# Patient Record
Sex: Female | Born: 1945 | Race: White | Hispanic: No | Marital: Single | Smoking: Former smoker
Health system: Southern US, Community
[De-identification: ages and names within clinical notes are randomized; demographics above are authoritative.]

## PROBLEM LIST (undated history)

## (undated) DIAGNOSIS — C259 Malignant neoplasm of pancreas, unspecified: Secondary | ICD-10-CM

## (undated) DIAGNOSIS — C801 Malignant (primary) neoplasm, unspecified: Secondary | ICD-10-CM

## (undated) DIAGNOSIS — N200 Calculus of kidney: Secondary | ICD-10-CM

## (undated) HISTORY — PX: KIDNEY STONE SURGERY: SHX686

## (undated) HISTORY — PX: OTHER SURGICAL HISTORY: SHX169

## (undated) HISTORY — PX: TONSILLECTOMY: SUR1361

---
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
## 2004-04-12 ENCOUNTER — Ambulatory Visit: Payer: Self-pay | Admitting: Unknown Physician Specialty

## 2005-04-25 ENCOUNTER — Ambulatory Visit: Payer: Self-pay | Admitting: Unknown Physician Specialty

## 2006-08-10 ENCOUNTER — Ambulatory Visit: Payer: Self-pay | Admitting: Unknown Physician Specialty

## 2007-03-01 HISTORY — PX: STAPEDECTOMY: SHX2435

## 2008-01-30 ENCOUNTER — Ambulatory Visit: Payer: Self-pay | Admitting: Family Medicine

## 2008-06-11 ENCOUNTER — Ambulatory Visit: Payer: Self-pay | Admitting: Urology

## 2008-10-28 ENCOUNTER — Other Ambulatory Visit: Payer: Self-pay | Admitting: Family Medicine

## 2009-03-17 ENCOUNTER — Emergency Department: Payer: Self-pay | Admitting: Emergency Medicine

## 2009-03-26 ENCOUNTER — Ambulatory Visit: Payer: Self-pay | Admitting: Family Medicine

## 2010-07-01 ENCOUNTER — Ambulatory Visit: Payer: Self-pay | Admitting: Family Medicine

## 2010-11-12 ENCOUNTER — Ambulatory Visit: Payer: Self-pay | Admitting: Unknown Physician Specialty

## 2011-07-05 ENCOUNTER — Ambulatory Visit: Payer: Self-pay | Admitting: Family Medicine

## 2012-09-18 ENCOUNTER — Ambulatory Visit: Payer: Self-pay | Admitting: Family Medicine

## 2013-09-27 ENCOUNTER — Ambulatory Visit: Payer: Self-pay | Admitting: Family Medicine

## 2014-07-22 ENCOUNTER — Other Ambulatory Visit: Payer: Self-pay | Admitting: Family Medicine

## 2014-07-22 DIAGNOSIS — N12 Tubulo-interstitial nephritis, not specified as acute or chronic: Secondary | ICD-10-CM

## 2014-07-24 ENCOUNTER — Ambulatory Visit
Admission: RE | Admit: 2014-07-24 | Discharge: 2014-07-24 | Disposition: A | Discharge status: Home / Self Care | Payer: Medicare PPO | Source: Ambulatory Visit | Attending: Family Medicine | Admitting: Family Medicine

## 2014-07-24 DIAGNOSIS — N12 Tubulo-interstitial nephritis, not specified as acute or chronic: Secondary | ICD-10-CM | POA: Diagnosis not present

## 2014-07-30 ENCOUNTER — Other Ambulatory Visit: Payer: Self-pay | Admitting: Urology

## 2014-07-30 DIAGNOSIS — R109 Unspecified abdominal pain: Secondary | ICD-10-CM

## 2014-07-30 DIAGNOSIS — R319 Hematuria, unspecified: Secondary | ICD-10-CM

## 2014-07-31 ENCOUNTER — Ambulatory Visit
Admission: RE | Admit: 2014-07-31 | Discharge: 2014-07-31 | Disposition: A | Discharge status: Home / Self Care | Payer: Medicare PPO | Source: Ambulatory Visit | Attending: Urology | Admitting: Urology

## 2014-07-31 ENCOUNTER — Ambulatory Visit: Admission: RE | Admit: 2014-07-31 | Payer: Medicare PPO | Source: Ambulatory Visit

## 2014-07-31 DIAGNOSIS — R109 Unspecified abdominal pain: Secondary | ICD-10-CM

## 2014-07-31 DIAGNOSIS — R310 Gross hematuria: Secondary | ICD-10-CM | POA: Insufficient documentation

## 2014-07-31 DIAGNOSIS — N289 Disorder of kidney and ureter, unspecified: Secondary | ICD-10-CM | POA: Diagnosis not present

## 2014-07-31 DIAGNOSIS — N200 Calculus of kidney: Secondary | ICD-10-CM | POA: Diagnosis not present

## 2014-07-31 DIAGNOSIS — R319 Hematuria, unspecified: Secondary | ICD-10-CM

## 2014-07-31 HISTORY — DX: Calculus of kidney: N20.0

## 2014-07-31 MED ORDER — IOHEXOL 350 MG/ML SOLN
125.0000 mL | Freq: Once | INTRAVENOUS | Status: AC | PRN
  Administered 2014-07-31: 150 mL via INTRAVENOUS

## 2014-08-01 ENCOUNTER — Telehealth: Payer: Self-pay | Admitting: Urology

## 2014-08-01 ENCOUNTER — Encounter: Payer: Self-pay | Admitting: Urology

## 2014-08-01 DIAGNOSIS — E559 Vitamin D deficiency, unspecified: Secondary | ICD-10-CM | POA: Insufficient documentation

## 2014-08-01 DIAGNOSIS — M8580 Other specified disorders of bone density and structure, unspecified site: Secondary | ICD-10-CM | POA: Insufficient documentation

## 2014-08-01 DIAGNOSIS — N200 Calculus of kidney: Secondary | ICD-10-CM | POA: Insufficient documentation

## 2014-08-01 DIAGNOSIS — E785 Hyperlipidemia, unspecified: Secondary | ICD-10-CM | POA: Insufficient documentation

## 2014-08-01 DIAGNOSIS — N132 Hydronephrosis with renal and ureteral calculous obstruction: Secondary | ICD-10-CM

## 2014-08-01 DIAGNOSIS — N951 Menopausal and female climacteric states: Secondary | ICD-10-CM | POA: Insufficient documentation

## 2014-08-01 MED ORDER — SULFAMETHOXAZOLE-TRIMETHOPRIM 800-160 MG PO TABS: 1.0000 | ORAL_TABLET | Freq: Once | ORAL | Status: DC

## 2014-08-01 NOTE — Telephone Encounter (Signed)
Talked to patient about stone and chance for eswl.to come in Monday for review of kub

## 2014-09-25 ENCOUNTER — Other Ambulatory Visit: Payer: Self-pay | Admitting: Family Medicine

## 2014-09-25 DIAGNOSIS — Z1239 Encounter for other screening for malignant neoplasm of breast: Secondary | ICD-10-CM

## 2014-10-07 ENCOUNTER — Ambulatory Visit
Admission: RE | Admit: 2014-10-07 | Discharge: 2014-10-07 | Disposition: A | Discharge status: Home / Self Care | Payer: Medicare PPO | Source: Ambulatory Visit | Attending: Family Medicine | Admitting: Family Medicine

## 2014-10-07 DIAGNOSIS — R922 Inconclusive mammogram: Secondary | ICD-10-CM | POA: Diagnosis not present

## 2014-10-07 DIAGNOSIS — Z1239 Encounter for other screening for malignant neoplasm of breast: Secondary | ICD-10-CM

## 2014-10-07 DIAGNOSIS — Z1231 Encounter for screening mammogram for malignant neoplasm of breast: Secondary | ICD-10-CM | POA: Diagnosis present

## 2014-10-07 HISTORY — DX: Malignant (primary) neoplasm, unspecified: C80.1

## 2014-10-08 ENCOUNTER — Ambulatory Visit: Payer: Medicare PPO

## 2014-10-08 ENCOUNTER — Ambulatory Visit
Admission: RE | Admit: 2014-10-08 | Discharge: 2014-10-08 | Disposition: A | Discharge status: Home / Self Care | Payer: Medicare PPO | Source: Ambulatory Visit | Attending: Family Medicine | Admitting: Family Medicine

## 2014-10-08 ENCOUNTER — Other Ambulatory Visit: Payer: Self-pay | Admitting: Family Medicine

## 2014-10-08 DIAGNOSIS — R928 Other abnormal and inconclusive findings on diagnostic imaging of breast: Secondary | ICD-10-CM

## 2014-10-08 DIAGNOSIS — N6489 Other specified disorders of breast: Secondary | ICD-10-CM | POA: Diagnosis present

## 2015-10-05 ENCOUNTER — Other Ambulatory Visit: Payer: Self-pay | Admitting: Family Medicine

## 2015-10-05 DIAGNOSIS — Z1231 Encounter for screening mammogram for malignant neoplasm of breast: Secondary | ICD-10-CM

## 2015-10-21 ENCOUNTER — Other Ambulatory Visit: Payer: Self-pay | Admitting: Family Medicine

## 2015-10-21 ENCOUNTER — Ambulatory Visit
Admission: RE | Admit: 2015-10-21 | Discharge: 2015-10-21 | Disposition: A | Discharge status: Home / Self Care | Payer: Medicare HMO | Source: Ambulatory Visit | Attending: Family Medicine | Admitting: Family Medicine

## 2015-10-21 DIAGNOSIS — Z1231 Encounter for screening mammogram for malignant neoplasm of breast: Secondary | ICD-10-CM

## 2016-05-17 HISTORY — PX: PORTA CATH INSERTION: CATH118285

## 2016-08-13 ENCOUNTER — Encounter: Payer: Self-pay | Admitting: Emergency Medicine

## 2016-08-13 ENCOUNTER — Emergency Department: Payer: Medicare HMO

## 2016-08-13 ENCOUNTER — Emergency Department
Admission: EM | Admit: 2016-08-13 | Discharge: 2016-08-13 | Disposition: A | Discharge status: Home / Self Care | Payer: Medicare HMO | Attending: Emergency Medicine | Admitting: Emergency Medicine

## 2016-08-13 DIAGNOSIS — Y998 Other external cause status: Secondary | ICD-10-CM | POA: Insufficient documentation

## 2016-08-13 DIAGNOSIS — Y92003 Bedroom of unspecified non-institutional (private) residence as the place of occurrence of the external cause: Secondary | ICD-10-CM | POA: Diagnosis not present

## 2016-08-13 DIAGNOSIS — Y9389 Activity, other specified: Secondary | ICD-10-CM | POA: Insufficient documentation

## 2016-08-13 DIAGNOSIS — W182XXA Fall in (into) shower or empty bathtub, initial encounter: Secondary | ICD-10-CM | POA: Diagnosis not present

## 2016-08-13 DIAGNOSIS — Z85828 Personal history of other malignant neoplasm of skin: Secondary | ICD-10-CM | POA: Insufficient documentation

## 2016-08-13 DIAGNOSIS — S42222A 2-part displaced fracture of surgical neck of left humerus, initial encounter for closed fracture: Secondary | ICD-10-CM | POA: Diagnosis not present

## 2016-08-13 DIAGNOSIS — S4992XA Unspecified injury of left shoulder and upper arm, initial encounter: Secondary | ICD-10-CM | POA: Diagnosis present

## 2016-08-13 HISTORY — DX: Malignant neoplasm of pancreas, unspecified: C25.9

## 2016-08-13 MED ORDER — BACLOFEN 10 MG PO TABS: 10.0000 mg | ORAL_TABLET | Freq: Three times a day (TID) | ORAL | 0 refills | Status: DC

## 2016-08-13 MED ORDER — OXYCODONE HCL 5 MG PO TABS: 5.0000 mg | ORAL_TABLET | Freq: Once | ORAL | Status: DC

## 2016-08-13 NOTE — ED Notes (Signed)
Pt reports receiving Chemo for Pancreatic CA, last treatment 2 weeks ago.

## 2016-08-13 NOTE — ED Triage Notes (Signed)
Pt reports she fell yesterday reports she was sitting at edge of tub and fell backwards, hit the left shoulder on the toilet. Visible swelling and bruise to left arm. Reports if no movement no pain but if stand to walk pain increases, pt talks in complete sentences no distress noted.

## 2016-08-13 NOTE — ED Provider Notes (Signed)
[facility] Emergency Department Provider Note ____________________________________________  Time seen: Approximately 2:13 PM  I have reviewed the triage vital signs and the nursing notes.   HISTORY  Chief Complaint Shoulder Injury    HPI Chelsey Watkins is a 71 y.o. female who presents to the emergency department for evaluation of left shoulder pain. While sitting on the edge of a bathtub, she fell backward and hit her left shoulder on the toilet. She was evaluated at [facility] and placed in anon-supportive sling and discharged home (per patient report). She states pain is unbearable when she is up walking around, but if seated the pain completely resolves. She has a history of osteopenia and is currently undergoing chemotherapy for pancreatic cancer. She takes roxicet when needed.  Past Medical History:  Diagnosis Date  . Cancer ([place])    skin ca  . Kidney stones   . Pancreatic cancer [facility])     Patient Active Problem List   Diagnosis Date Noted  . HLD (hyperlipidemia) 08/01/2014  . Calculus of kidney 08/01/2014  . Osteopenia 08/01/2014  . Post menopausal syndrome 08/01/2014  . Avitaminosis D 08/01/2014    History reviewed. No pertinent surgical history.  Prior to Admission medications   Medication Sig Start Date End Date Taking? Authorizing Provider  baclofen (LIORESAL) 10 MG tablet Take 1 tablet (10 mg total) by mouth 3 (three) times daily. 08/13/16   Victorino Dike, FNP    Allergies Patient has no known allergies.  Family History  Problem Relation Age of Onset  . Breast cancer Paternal Grandmother 45    Social History Social History  Substance Use Topics  . Smoking status: Never Smoker  . Smokeless tobacco: Former Systems developer  . Alcohol use No    Review of Systems Constitutional: Chronically ill appearing. Respiratory: Negative for shortness of breath Musculoskeletal: Positive for left shoulder/upper arm pain. Skin: Positive for  ecchymosis over the left upper arm.  Neurological: Negative for decrease in sensation of the left arm/hand.  ____________________________________________   PHYSICAL EXAM:  VITAL SIGNS: ED Triage Vitals  Enc Vitals Group     BP --      Pulse --      Resp --      Temp --      Temp src --      SpO2 --      Weight 08/13/16 1151 100 lb (45.4 kg)     Height 08/13/16 1151 5\' 2"  (1.575 m)     Head Circumference --      Peak Flow --      Pain Score 08/13/16 1150 0     Pain Loc --      Pain Edu? --      Excl. in [place]? --     Constitutional: Alert and oriented. Well appearing and in no acute distress. Eyes: No conjunctival erythema or edema.  Head: Atraumatic Neck: Active, full range of motion. Respiratory: Respirations are even and unlabored. Musculoskeletal: Bony tenderness of the left upper arm. No tenderness over the left elbow, wrist, or hand. Neurologic: Awake, alert, and oriented x 4.   Skin: Ecchymosis over the left humerus. No open wounds or abrasions ____________________________________________   LABS (all labs ordered are listed, but only abnormal results are displayed)  Labs Reviewed - No data to display ____________________________________________  RADIOLOGY  Left shoulder:  IMPRESSION:  Displaced, impacted surgical neck fracture of the humerus.    ____________________________________________   PROCEDURES  Procedure(s) performed: Shoulder immobilizer  ____________________________________________   INITIAL IMPRESSION / ASSESSMENT AND PLAN / ED COURSE  Chelsey Watkins is a 71 y.o. female who presents to the emergency department for treatment and evaluation of fracture of the left humerus. Sling that was not providing much support at all was replaced with shoulder immobilizer. She states that that did provide her with some relief. She was given a prescription for baclofen and encouraged to continue taking her oxycodone as prescribed. She was advised to  call Monday morning to schedule an appointment with orthopedics and given the name and number for Dr. Suzie Portela office. She was advised to return to the emergency department for symptoms that change or worsen if she is unable schedule an appointment.  Pertinent labs & imaging results that were available during my care of the patient were reviewed by me and considered in my medical decision making (see chart for details).  _________________________________________   FINAL CLINICAL IMPRESSION(S) / ED DIAGNOSES  Final diagnoses:  Closed 2-part displaced fracture of surgical neck of left humerus, initial encounter    Discharge Medication List as of 08/13/2016  2:26 PM    START taking these medications   Details  baclofen (LIORESAL) 10 MG tablet Take 1 tablet (10 mg total) by mouth 3 (three) times daily., Starting Sat 08/13/2016, Print        If controlled substance prescribed during this visit, 12 month history viewed on the [place] prior to issuing an initial prescription for Schedule II or III opiod.    Victorino Dike, FNP 08/13/16 1550    Delman Kitten, MD 08/13/16 [phone]

## 2016-08-13 NOTE — Discharge Instructions (Signed)
Please call Dr. Nicholaus Bloom office on Monday morning to schedule an appointment. Take your oxycodone as prescribed. In addition, take the baclofen if needed for pain. Wear the shoulder immobilizer until you are seen by the orthopedist. Return to the ER for symptoms that change or worsen if unable to schedule an appointment.

## 2016-08-15 ENCOUNTER — Encounter: Payer: Self-pay | Admitting: Emergency Medicine

## 2016-08-15 ENCOUNTER — Emergency Department
Admission: EM | Admit: 2016-08-15 | Discharge: 2016-08-15 | Disposition: A | Discharge status: Home / Self Care | Payer: Medicare HMO | Attending: Emergency Medicine | Admitting: Emergency Medicine

## 2016-08-15 ENCOUNTER — Emergency Department: Payer: Medicare HMO

## 2016-08-15 DIAGNOSIS — Z85828 Personal history of other malignant neoplasm of skin: Secondary | ICD-10-CM | POA: Diagnosis not present

## 2016-08-15 DIAGNOSIS — M545 Low back pain, unspecified: Secondary | ICD-10-CM

## 2016-08-15 MED ORDER — LIDOCAINE 5 % EX PTCH
1.0000 | MEDICATED_PATCH | CUTANEOUS | Status: DC
  Administered 2016-08-15: 1 via TRANSDERMAL
  Filled 2016-08-15: qty 1

## 2016-08-15 MED ORDER — LIDOCAINE 5 % EX PTCH: 1.0000 | MEDICATED_PATCH | Freq: Two times a day (BID) | CUTANEOUS | 0 refills | Status: DC

## 2016-08-15 NOTE — ED Provider Notes (Signed)
[facility] Emergency Department Provider Note   ____________________________________________   I have reviewed the triage vital signs and the nursing notes.   HISTORY  Chief Complaint Back Pain   History limited by: Not Limited   HPI Chelsey Watkins is a 71 y.o. female who presents to the emergency department today because of concerns for back pain. The patient states that it started 4 days ago. She did have a fall 4 days ago. It resulted in a left humerus fracture. She continues to complain of pain there. She states that she did started developing pain in her left lower back. Pain did not start immediately after the fall. She has not noticed any pain going down her legs. No numbness down her legs. No change in urination or defecation.    Past Medical History:  Diagnosis Date  . Cancer ([place])    skin ca  . Kidney stones   . Pancreatic cancer [facility])     Patient Active Problem List   Diagnosis Date Noted  . HLD (hyperlipidemia) 08/01/2014  . Calculus of kidney 08/01/2014  . Osteopenia 08/01/2014  . Post menopausal syndrome 08/01/2014  . Avitaminosis D 08/01/2014    History reviewed. No pertinent surgical history.  Prior to Admission medications   Medication Sig Start Date End Date Taking? Authorizing Provider  baclofen (LIORESAL) 10 MG tablet Take 1 tablet (10 mg total) by mouth 3 (three) times daily. 08/13/16   Triplett, Cari B, FNP  lidocaine (LIDODERM) 5 % Place 1 patch onto the skin every 12 (twelve) hours. Remove & Discard patch within 12 hours or as directed by MD 08/15/16 08/15/17  Nance Pear, MD    Allergies Patient has no known allergies.  Family History  Problem Relation Age of Onset  . Breast cancer Paternal Grandmother 75    Social History Social History  Substance Use Topics  . Smoking status: Never Smoker  . Smokeless tobacco: Former Systems developer  . Alcohol use No    Review of Systems Constitutional: No fever/chills Eyes:  No visual changes. ENT: No sore throat. Cardiovascular: Denies chest pain. Respiratory: Denies shortness of breath. Gastrointestinal: No abdominal pain.  No nausea, no vomiting.  No diarrhea.   Genitourinary: Negative for dysuria. Musculoskeletal: Positive for low back pain positive for left shoulder. Skin: Negative for rash. Neurological: Negative for headaches, focal weakness or numbness.  ____________________________________________   PHYSICAL EXAM:  VITAL SIGNS: ED Triage Vitals  Enc Vitals Group     BP 08/15/16 2148 116/66     Pulse Rate 08/15/16 2148 (!) 104     Resp 08/15/16 2148 18     Temp 08/15/16 2148 98.6 F (37 C)     Temp Source 08/15/16 2148 Oral     SpO2 08/15/16 2136 98 %     Weight 08/15/16 2148 100 lb (45.4 kg)     Height 08/15/16 2148 5\' 2"  (1.575 m)     Head Circumference --      Peak Flow --      Pain Score 08/15/16 2148 10   Constitutional: Alert and oriented. Well appearing and in no distress. Eyes: Conjunctivae are normal.  ENT   Head: Normocephalic and atraumatic.   Nose: No congestion/rhinnorhea.   Mouth/Throat: Mucous membranes are moist.   Neck: No stridor. Hematological/Lymphatic/Immunilogical: No cervical lymphadenopathy. Cardiovascular: Normal rate, regular rhythm.  No murmurs, rubs, or gallops.  Respiratory: Normal respiratory effort without tachypnea nor retractions. Breath sounds are clear and equal bilaterally. No wheezes/rales/rhonchi. Gastrointestinal: Soft  and non tender. No rebound. No guarding.  Genitourinary: Deferred Musculoskeletal: Left shoulder and arm in sling. Patient without any midline spinal tenderness. Some tenderness to the left of her lumbar spine. Neurologic:  Normal speech and language. No gross focal neurologic deficits are appreciated.  Skin:  Skin is warm, dry and intact. No rash noted. Psychiatric: Mood and affect are normal. Speech and behavior are normal. Patient exhibits appropriate insight and  judgment.  ____________________________________________    LABS (pertinent positives/negatives)  None  ____________________________________________   EKG  None  ____________________________________________    RADIOLOGY  Lumbar spine IMPRESSION: Age indeterminate mild compression fracture of the L1. Correlation with clinical exam and point tenderness recommended. CT may provide better evaluation.  ____________________________________________   PROCEDURES  Procedures  ____________________________________________   INITIAL IMPRESSION / ASSESSMENT AND PLAN / ED COURSE  Pertinent labs & imaging results that were available during my care of the patient were reviewed by me and considered in my medical decision making (see chart for details).  Patient presented to the emergency department today with concerns for low back pain. Lumbar spine x-ray showed an age-indeterminate compression fracture. At this point I doubt acute fracture given lack of midline tenderness. However try outpatient on Lidoderm. Patient has yet to follow up with orthopedic, she has not called for an appointment. Will give ortho follow up information.  ____________________________________________   FINAL CLINICAL IMPRESSION(S) / ED DIAGNOSES  Final diagnoses:  Low back pain without sciatica, unspecified back pain laterality, unspecified chronicity     Note: This dictation was prepared with Dragon dictation. Any transcriptional errors that result from this process are unintentional      Nance Pear, MD 08/15/16 2325

## 2016-08-15 NOTE — Discharge Instructions (Signed)
Please seek medical attention for any high fevers, chest pain, shortness of breath, change in behavior, persistent vomiting, bloody stool or any other new or concerning symptoms.  

## 2016-08-15 NOTE — ED Notes (Signed)
ED Provider at bedside. 
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
## 2016-08-15 NOTE — ED Notes (Signed)
Patient transported to X-ray 

## 2016-09-27 ENCOUNTER — Other Ambulatory Visit: Payer: Self-pay | Admitting: Family Medicine

## 2016-09-27 DIAGNOSIS — Z1231 Encounter for screening mammogram for malignant neoplasm of breast: Secondary | ICD-10-CM

## 2016-10-26 ENCOUNTER — Ambulatory Visit
Admission: RE | Admit: 2016-10-26 | Discharge: 2016-10-26 | Disposition: A | Discharge status: Home / Self Care | Payer: Medicare HMO | Source: Ambulatory Visit | Attending: Family Medicine | Admitting: Family Medicine

## 2016-10-26 DIAGNOSIS — Z1231 Encounter for screening mammogram for malignant neoplasm of breast: Secondary | ICD-10-CM | POA: Diagnosis not present

## 2016-12-09 ENCOUNTER — Inpatient Hospital Stay: Payer: Medicare HMO | Admitting: Anesthesiology

## 2016-12-09 ENCOUNTER — Inpatient Hospital Stay: Payer: Medicare HMO

## 2016-12-09 ENCOUNTER — Emergency Department: Payer: Medicare HMO

## 2016-12-09 ENCOUNTER — Encounter: Payer: Self-pay | Admitting: Emergency Medicine

## 2016-12-09 ENCOUNTER — Encounter
Admission: EM | Disposition: A | Discharge status: Home Health | Payer: Self-pay | Source: Home / Self Care | Attending: Internal Medicine

## 2016-12-09 ENCOUNTER — Inpatient Hospital Stay
Admission: EM | Admit: 2016-12-09 | Discharge: 2016-12-11 | DRG: 469 | Disposition: A | Discharge status: Home Health | Payer: Medicare HMO | Attending: Internal Medicine | Admitting: Internal Medicine

## 2016-12-09 DIAGNOSIS — D630 Anemia in neoplastic disease: Secondary | ICD-10-CM | POA: Diagnosis present

## 2016-12-09 DIAGNOSIS — Z419 Encounter for procedure for purposes other than remedying health state, unspecified: Secondary | ICD-10-CM

## 2016-12-09 DIAGNOSIS — S72011A Unspecified intracapsular fracture of right femur, initial encounter for closed fracture: Secondary | ICD-10-CM | POA: Diagnosis present

## 2016-12-09 DIAGNOSIS — Z7952 Long term (current) use of systemic steroids: Secondary | ICD-10-CM | POA: Diagnosis not present

## 2016-12-09 DIAGNOSIS — Z803 Family history of malignant neoplasm of breast: Secondary | ICD-10-CM | POA: Diagnosis not present

## 2016-12-09 DIAGNOSIS — S72001A Fracture of unspecified part of neck of right femur, initial encounter for closed fracture: Secondary | ICD-10-CM

## 2016-12-09 DIAGNOSIS — E43 Unspecified severe protein-calorie malnutrition: Secondary | ICD-10-CM | POA: Diagnosis present

## 2016-12-09 DIAGNOSIS — Z85828 Personal history of other malignant neoplasm of skin: Secondary | ICD-10-CM

## 2016-12-09 DIAGNOSIS — S72009A Fracture of unspecified part of neck of unspecified femur, initial encounter for closed fracture: Secondary | ICD-10-CM | POA: Diagnosis present

## 2016-12-09 DIAGNOSIS — W010XXA Fall on same level from slipping, tripping and stumbling without subsequent striking against object, initial encounter: Secondary | ICD-10-CM | POA: Diagnosis present

## 2016-12-09 DIAGNOSIS — D6481 Anemia due to antineoplastic chemotherapy: Secondary | ICD-10-CM | POA: Diagnosis present

## 2016-12-09 DIAGNOSIS — Z79899 Other long term (current) drug therapy: Secondary | ICD-10-CM | POA: Diagnosis not present

## 2016-12-09 DIAGNOSIS — Z79891 Long term (current) use of opiate analgesic: Secondary | ICD-10-CM

## 2016-12-09 DIAGNOSIS — C259 Malignant neoplasm of pancreas, unspecified: Secondary | ICD-10-CM | POA: Diagnosis present

## 2016-12-09 DIAGNOSIS — Y92009 Unspecified place in unspecified non-institutional (private) residence as the place of occurrence of the external cause: Secondary | ICD-10-CM | POA: Diagnosis not present

## 2016-12-09 DIAGNOSIS — Z681 Body mass index (BMI) 19 or less, adult: Secondary | ICD-10-CM

## 2016-12-09 DIAGNOSIS — G8918 Other acute postprocedural pain: Secondary | ICD-10-CM

## 2016-12-09 DIAGNOSIS — Z87891 Personal history of nicotine dependence: Secondary | ICD-10-CM | POA: Diagnosis not present

## 2016-12-09 DIAGNOSIS — M25551 Pain in right hip: Secondary | ICD-10-CM | POA: Diagnosis present

## 2016-12-09 HISTORY — PX: ANTERIOR APPROACH HEMI HIP ARTHROPLASTY: SHX6690

## 2016-12-09 LAB — BASIC METABOLIC PANEL
ANION GAP: 3 — AB (ref 5–15)
BUN: 8 mg/dL (ref 6–20)
CALCIUM: 7.9 mg/dL — AB (ref 8.9–10.3)
CHLORIDE: 108 mmol/L (ref 101–111)
CO2: 26 mmol/L (ref 22–32)
Creatinine, Ser: 0.46 mg/dL (ref 0.44–1.00)
GFR calc non Af Amer: >60 mL/min (ref >60)
GLUCOSE: 88 mg/dL (ref 65–99)
POTASSIUM: 3.9 mmol/L (ref 3.5–5.1)
Sodium: 137 mmol/L (ref 135–145)

## 2016-12-09 LAB — MRSA PCR SCREENING: MRSA by PCR: NEGATIVE

## 2016-12-09 LAB — CBC
HEMATOCRIT: 24.6 % — AB (ref 35.0–47.0)
HEMOGLOBIN: 7.9 g/dL — AB (ref 12.0–16.0)
MCH: 33 pg (ref 26.0–34.0)
MCHC: 32 g/dL (ref 32.0–36.0)
MCV: 102.9 fL — AB (ref 80.0–100.0)
Platelets: 189 10*3/uL (ref 150–440)
RBC: 2.39 MIL/uL — AB (ref 3.80–5.20)
RDW: 21 % — ABNORMAL HIGH (ref 11.5–14.5)
WBC: 11.3 10*3/uL — ABNORMAL HIGH (ref 3.6–11.0)

## 2016-12-09 LAB — PROTIME-INR
INR: 1.07
PROTHROMBIN TIME: 13.8 s (ref 11.4–15.2)

## 2016-12-09 SURGERY — HEMIARTHROPLASTY, HIP, DIRECT ANTERIOR APPROACH, FOR FRACTURE
Anesthesia: General | Site: Hip | Laterality: Right | Wound class: Clean

## 2016-12-09 MED ORDER — KETOROLAC TROMETHAMINE 15 MG/ML IJ SOLN
15.0000 mg | Freq: Four times a day (QID) | INTRAMUSCULAR | Status: DC
  Administered 2016-12-10 – 2016-12-11 (×5): 15 mg via INTRAVENOUS
  Filled 2016-12-09 (×6): qty 1

## 2016-12-09 MED ORDER — LIDOCAINE HCL (PF) 2 % IJ SOLN
INTRAMUSCULAR | Status: AC
  Filled 2016-12-09: qty 10

## 2016-12-09 MED ORDER — MORPHINE SULFATE (PF) 2 MG/ML IV SOLN: 2.0000 mg | INTRAVENOUS | Status: DC | PRN

## 2016-12-09 MED ORDER — PHENYLEPHRINE HCL 10 MG/ML IJ SOLN
INTRAMUSCULAR | Status: DC | PRN
  Administered 2016-12-09 (×2): 100 ug via INTRAVENOUS

## 2016-12-09 MED ORDER — POLYETHYLENE GLYCOL 3350 17 G PO PACK: 17.0000 g | PACK | Freq: Every day | ORAL | Status: DC

## 2016-12-09 MED ORDER — LORAZEPAM 2 MG/ML IJ SOLN
1.0000 mg | Freq: Once | INTRAMUSCULAR | Status: AC
  Administered 2016-12-09: 1 mg via INTRAVENOUS
  Filled 2016-12-09: qty 1

## 2016-12-09 MED ORDER — DEXAMETHASONE SODIUM PHOSPHATE 10 MG/ML IJ SOLN
INTRAMUSCULAR | Status: AC
Start: 2016-12-09 — End: 2016-12-09
  Filled 2016-12-09: qty 1

## 2016-12-09 MED ORDER — ACETAMINOPHEN 500 MG PO TABS
1000.0000 mg | ORAL_TABLET | Freq: Four times a day (QID) | ORAL | Status: AC
  Administered 2016-12-10 (×3): 1000 mg via ORAL
  Filled 2016-12-09 (×3): qty 2

## 2016-12-09 MED ORDER — SODIUM CHLORIDE 0.9 % IV SOLN
INTRAVENOUS | Status: DC
  Administered 2016-12-09 – 2016-12-10 (×2): via INTRAVENOUS

## 2016-12-09 MED ORDER — POTASSIUM CHLORIDE CRYS ER 20 MEQ PO TBCR
20.0000 meq | EXTENDED_RELEASE_TABLET | Freq: Every day | ORAL | Status: DC
  Administered 2016-12-10: 20 meq via ORAL
  Filled 2016-12-09 (×2): qty 1

## 2016-12-09 MED ORDER — ONDANSETRON HCL 4 MG/2ML IJ SOLN: 4.0000 mg | Freq: Once | INTRAMUSCULAR | Status: DC | PRN

## 2016-12-09 MED ORDER — SODIUM CHLORIDE 0.9 % IV BOLUS (SEPSIS)
1000.0000 mL | Freq: Once | INTRAVENOUS | Status: AC
  Administered 2016-12-09: 1000 mL via INTRAVENOUS

## 2016-12-09 MED ORDER — BACLOFEN 10 MG PO TABS
10.0000 mg | ORAL_TABLET | Freq: Three times a day (TID) | ORAL | Status: DC
  Filled 2016-12-09: qty 1

## 2016-12-09 MED ORDER — ONDANSETRON HCL 4 MG PO TABS: 4.0000 mg | ORAL_TABLET | Freq: Four times a day (QID) | ORAL | Status: DC | PRN

## 2016-12-09 MED ORDER — DOCUSATE SODIUM 100 MG PO CAPS
100.0000 mg | ORAL_CAPSULE | Freq: Two times a day (BID) | ORAL | Status: DC
  Administered 2016-12-09 – 2016-12-10 (×3): 100 mg via ORAL
  Filled 2016-12-09 (×4): qty 1

## 2016-12-09 MED ORDER — CEFAZOLIN SODIUM-DEXTROSE 1-4 GM/50ML-% IV SOLN
INTRAVENOUS | Status: AC
  Filled 2016-12-09: qty 50

## 2016-12-09 MED ORDER — PROPOFOL 10 MG/ML IV BOLUS
INTRAVENOUS | Status: DC | PRN
  Administered 2016-12-09: 80 mg via INTRAVENOUS

## 2016-12-09 MED ORDER — KETOROLAC TROMETHAMINE 15 MG/ML IJ SOLN
15.0000 mg | Freq: Four times a day (QID) | INTRAMUSCULAR | Status: DC | PRN
  Administered 2016-12-09: 15 mg via INTRAVENOUS
  Filled 2016-12-09 (×2): qty 1

## 2016-12-09 MED ORDER — SENNOSIDES-DOCUSATE SODIUM 8.6-50 MG PO TABS: 1.0000 | ORAL_TABLET | Freq: Every evening | ORAL | Status: DC | PRN

## 2016-12-09 MED ORDER — FAMOTIDINE 20 MG PO TABS
20.0000 mg | ORAL_TABLET | Freq: Once | ORAL | Status: AC
  Administered 2016-12-09: 20 mg via ORAL

## 2016-12-09 MED ORDER — ACETAMINOPHEN 650 MG RE SUPP: 650.0000 mg | Freq: Four times a day (QID) | RECTAL | Status: DC | PRN

## 2016-12-09 MED ORDER — ONDANSETRON HCL 4 MG/2ML IJ SOLN
4.0000 mg | Freq: Four times a day (QID) | INTRAMUSCULAR | Status: DC | PRN
Start: 2016-12-09 — End: 2016-12-11

## 2016-12-09 MED ORDER — HYDROCODONE-ACETAMINOPHEN 5-325 MG PO TABS: 1.0000 | ORAL_TABLET | ORAL | Status: DC | PRN

## 2016-12-09 MED ORDER — SULFAMETHOXAZOLE-TRIMETHOPRIM 800-160 MG PO TABS
1.0000 | ORAL_TABLET | Freq: Two times a day (BID) | ORAL | Status: DC
  Administered 2016-12-09 – 2016-12-10 (×2): 1 via ORAL
  Filled 2016-12-09 (×4): qty 1

## 2016-12-09 MED ORDER — MORPHINE SULFATE (PF) 4 MG/ML IV SOLN
4.0000 mg | Freq: Once | INTRAVENOUS | Status: AC
Start: 2016-12-09 — End: 2016-12-09
  Administered 2016-12-09: 4 mg via INTRAVENOUS

## 2016-12-09 MED ORDER — FENTANYL CITRATE (PF) 100 MCG/2ML IJ SOLN
INTRAMUSCULAR | Status: AC
  Filled 2016-12-09: qty 2

## 2016-12-09 MED ORDER — DIPHENHYDRAMINE HCL 12.5 MG/5ML PO ELIX: 12.5000 mg | ORAL_SOLUTION | ORAL | Status: DC | PRN

## 2016-12-09 MED ORDER — ENOXAPARIN SODIUM 30 MG/0.3ML ~~LOC~~ SOLN
30.0000 mg | Freq: Two times a day (BID) | SUBCUTANEOUS | Status: DC
  Administered 2016-12-10 (×2): 30 mg via SUBCUTANEOUS
  Filled 2016-12-09 (×3): qty 0.3

## 2016-12-09 MED ORDER — MIDAZOLAM HCL 2 MG/2ML IJ SOLN
INTRAMUSCULAR | Status: AC
  Filled 2016-12-09: qty 2

## 2016-12-09 MED ORDER — MAGNESIUM OXIDE 400 (241.3 MG) MG PO TABS
400.0000 mg | ORAL_TABLET | Freq: Two times a day (BID) | ORAL | Status: DC
  Administered 2016-12-09 – 2016-12-10 (×2): 400 mg via ORAL
  Filled 2016-12-09 (×4): qty 1

## 2016-12-09 MED ORDER — MIDAZOLAM HCL 2 MG/2ML IJ SOLN
INTRAMUSCULAR | Status: DC | PRN
Start: 2016-12-09 — End: 2016-12-09
  Administered 2016-12-09: 2 mg via INTRAVENOUS

## 2016-12-09 MED ORDER — OXYCODONE HCL 5 MG PO TABS: 10.0000 mg | ORAL_TABLET | ORAL | Status: DC | PRN

## 2016-12-09 MED ORDER — MAGNESIUM HYDROXIDE 400 MG/5ML PO SUSP: 30.0000 mL | Freq: Every day | ORAL | Status: DC | PRN

## 2016-12-09 MED ORDER — ACETAMINOPHEN 325 MG PO TABS: 650.0000 mg | ORAL_TABLET | Freq: Four times a day (QID) | ORAL | Status: DC | PRN

## 2016-12-09 MED ORDER — PROCHLORPERAZINE MALEATE 10 MG PO TABS
10.0000 mg | ORAL_TABLET | Freq: Three times a day (TID) | ORAL | Status: DC | PRN
  Filled 2016-12-09: qty 1

## 2016-12-09 MED ORDER — MORPHINE SULFATE (PF) 4 MG/ML IV SOLN
4.0000 mg | Freq: Once | INTRAVENOUS | Status: AC
  Administered 2016-12-09: 4 mg via INTRAVENOUS
  Filled 2016-12-09: qty 1

## 2016-12-09 MED ORDER — FENTANYL CITRATE (PF) 100 MCG/2ML IJ SOLN: 25.0000 ug | INTRAMUSCULAR | Status: DC | PRN

## 2016-12-09 MED ORDER — OXYCODONE HCL 5 MG PO TABS
15.0000 mg | ORAL_TABLET | ORAL | Status: DC | PRN
  Administered 2016-12-09 – 2016-12-11 (×3): 15 mg via ORAL
  Filled 2016-12-09 (×4): qty 3

## 2016-12-09 MED ORDER — LACTATED RINGERS IV SOLN
INTRAVENOUS | Status: DC
  Administered 2016-12-09 (×3): via INTRAVENOUS

## 2016-12-09 MED ORDER — MORPHINE SULFATE (PF) 4 MG/ML IV SOLN
INTRAVENOUS | Status: AC
  Filled 2016-12-09: qty 1

## 2016-12-09 MED ORDER — MENTHOL 3 MG MT LOZG
1.0000 | LOZENGE | OROMUCOSAL | Status: DC | PRN
  Filled 2016-12-09: qty 9

## 2016-12-09 MED ORDER — ALUM & MAG HYDROXIDE-SIMETH 200-200-20 MG/5ML PO SUSP: 30.0000 mL | ORAL | Status: DC | PRN

## 2016-12-09 MED ORDER — DEXAMETHASONE 4 MG PO TABS
8.0000 mg | ORAL_TABLET | Freq: Every day | ORAL | Status: DC
  Filled 2016-12-09 (×2): qty 2

## 2016-12-09 MED ORDER — DEXTROSE-NACL 5-0.9 % IV SOLN
INTRAVENOUS | Status: DC
  Administered 2016-12-09: 07:00:00 via INTRAVENOUS

## 2016-12-09 MED ORDER — BISACODYL 10 MG RE SUPP: 10.0000 mg | Freq: Every day | RECTAL | Status: DC | PRN

## 2016-12-09 MED ORDER — OXYCODONE HCL ER 20 MG PO T12A
20.0000 mg | EXTENDED_RELEASE_TABLET | Freq: Two times a day (BID) | ORAL | Status: DC
  Administered 2016-12-09 – 2016-12-10 (×2): 20 mg via ORAL
  Filled 2016-12-09 (×3): qty 1

## 2016-12-09 MED ORDER — FAMOTIDINE 20 MG PO TABS
ORAL_TABLET | ORAL | Status: AC
  Filled 2016-12-09: qty 1

## 2016-12-09 MED ORDER — PHENOL 1.4 % MT LIQD
1.0000 | OROMUCOSAL | Status: DC | PRN
  Filled 2016-12-09: qty 177

## 2016-12-09 MED ORDER — ONDANSETRON HCL 4 MG/2ML IJ SOLN
4.0000 mg | Freq: Four times a day (QID) | INTRAMUSCULAR | Status: DC | PRN
  Administered 2016-12-09: 4 mg via INTRAVENOUS

## 2016-12-09 MED ORDER — CEFAZOLIN SODIUM-DEXTROSE 1-4 GM/50ML-% IV SOLN
1.0000 g | Freq: Once | INTRAVENOUS | Status: AC
  Administered 2016-12-09: 1 g via INTRAVENOUS
  Filled 2016-12-09: qty 50

## 2016-12-09 MED ORDER — ONDANSETRON HCL 4 MG/2ML IJ SOLN
INTRAMUSCULAR | Status: AC
  Filled 2016-12-09: qty 2

## 2016-12-09 MED ORDER — NEOMYCIN-POLYMYXIN B GU 40-200000 IR SOLN
Status: DC | PRN
  Administered 2016-12-09: 4 mL

## 2016-12-09 MED ORDER — FENTANYL CITRATE (PF) 100 MCG/2ML IJ SOLN
INTRAMUSCULAR | Status: DC | PRN
  Administered 2016-12-09 (×3): 50 ug via INTRAVENOUS
  Administered 2016-12-09: 100 ug via INTRAVENOUS

## 2016-12-09 MED ORDER — SEVOFLURANE IN SOLN
RESPIRATORY_TRACT | Status: AC
  Filled 2016-12-09: qty 250

## 2016-12-09 MED ORDER — DEXAMETHASONE SODIUM PHOSPHATE 10 MG/ML IJ SOLN
INTRAMUSCULAR | Status: DC | PRN
  Administered 2016-12-09: 10 mg via INTRAVENOUS

## 2016-12-09 MED ORDER — LIDOCAINE HCL (CARDIAC) 20 MG/ML IV SOLN
INTRAVENOUS | Status: DC | PRN
  Administered 2016-12-09: 60 mg via INTRAVENOUS

## 2016-12-09 MED ORDER — BUPIVACAINE-EPINEPHRINE (PF) 0.25% -1:200000 IJ SOLN
INTRAMUSCULAR | Status: DC | PRN
  Administered 2016-12-09: 30 mL

## 2016-12-09 MED ORDER — ONDANSETRON HCL 4 MG/2ML IJ SOLN
4.0000 mg | Freq: Once | INTRAMUSCULAR | Status: AC
  Administered 2016-12-09: 4 mg via INTRAVENOUS

## 2016-12-09 MED ORDER — MAGNESIUM CITRATE PO SOLN
1.0000 | Freq: Once | ORAL | Status: DC | PRN
  Filled 2016-12-09: qty 296

## 2016-12-09 SURGICAL SUPPLY — 53 items
BLADE SAW SAG 18.5X105 (BLADE) ×3 IMPLANT
BNDG COHESIVE 6X5 TAN STRL LF (GAUZE/BANDAGES/DRESSINGS) ×9 IMPLANT
CANISTER SUCT 1200ML W/VALVE (MISCELLANEOUS) ×3 IMPLANT
CAPT HIP HEMI 2 ×3 IMPLANT
CATH TRAY METER 16FR LF (MISCELLANEOUS) ×1 IMPLANT
CHLORAPREP W/TINT 26ML (MISCELLANEOUS) ×3 IMPLANT
DRAPE C-ARM XRAY 36X54 (DRAPES) ×3 IMPLANT
DRAPE INCISE IOBAN 66X60 STRL (DRAPES) IMPLANT
DRAPE POUCH INSTRU U-SHP 10X18 (DRAPES) ×3 IMPLANT
DRAPE SHEET LG 3/4 BI-LAMINATE (DRAPES) ×9 IMPLANT
DRAPE TABLE BACK 80X90 (DRAPES) ×3 IMPLANT
DRESSING SURGICEL FIBRLLR 1X2 (HEMOSTASIS) ×2 IMPLANT
DRSG OPSITE POSTOP 4X8 (GAUZE/BANDAGES/DRESSINGS) ×6 IMPLANT
DRSG SURGICEL FIBRILLAR 1X2 (HEMOSTASIS) ×6
ELECT BLADE 6.5 EXT (BLADE) ×3 IMPLANT
ELECT REM PT RETURN 9FT ADLT (ELECTROSURGICAL) ×3
ELECTRODE REM PT RTRN 9FT ADLT (ELECTROSURGICAL) ×1 IMPLANT
EVACUATOR 1/8 PVC DRAIN (DRAIN) IMPLANT
GLOVE BIOGEL PI IND STRL 7.0 (GLOVE) IMPLANT
GLOVE BIOGEL PI IND STRL 9 (GLOVE) ×1 IMPLANT
GLOVE BIOGEL PI INDICATOR 7.0 (GLOVE) ×12
GLOVE BIOGEL PI INDICATOR 9 (GLOVE) ×2
GLOVE SURG SYN 9.0  PF PI (GLOVE) ×4
GLOVE SURG SYN 9.0 PF PI (GLOVE) ×2 IMPLANT
GOWN SRG 2XL LVL 4 RGLN SLV (GOWNS) ×1 IMPLANT
GOWN STRL NON-REIN 2XL LVL4 (GOWNS) ×3
GOWN STRL REUS W/ TWL LRG LVL3 (GOWN DISPOSABLE) ×1 IMPLANT
GOWN STRL REUS W/TWL LRG LVL3 (GOWN DISPOSABLE) ×6
HOLDER FOLEY CATH W/STRAP (MISCELLANEOUS) ×3 IMPLANT
HOOD PEEL AWAY FLYTE STAYCOOL (MISCELLANEOUS) ×3 IMPLANT
KIT PREVENA INCISION MGT 13 (CANNISTER) ×3 IMPLANT
MAT BLUE FLOOR 46X72 FLO (MISCELLANEOUS) ×3 IMPLANT
NDL SAFETY 18GX1.5 (NEEDLE) ×3 IMPLANT
NEEDLE SPNL 18GX3.5 QUINCKE PK (NEEDLE) ×3 IMPLANT
NS IRRIG 1000ML POUR BTL (IV SOLUTION) ×3 IMPLANT
PACK HIP COMPR (MISCELLANEOUS) ×3 IMPLANT
SOL PREP PVP 2OZ (MISCELLANEOUS) ×3
SOLUTION PREP PVP 2OZ (MISCELLANEOUS) ×1 IMPLANT
SPONGE DRAIN TRACH 4X4 STRL 2S (GAUZE/BANDAGES/DRESSINGS) ×3 IMPLANT
STAPLER SKIN PROX 35W (STAPLE) ×3 IMPLANT
STRAP SAFETY BODY (MISCELLANEOUS) ×3 IMPLANT
SUT DVC 2 QUILL PDO  T11 36X36 (SUTURE) ×2
SUT DVC 2 QUILL PDO T11 36X36 (SUTURE) ×1 IMPLANT
SUT SILK 0 (SUTURE) ×3
SUT SILK 0 30XBRD TIE 6 (SUTURE) ×1 IMPLANT
SUT V-LOC 90 ABS DVC 3-0 CL (SUTURE) ×3 IMPLANT
SUT VIC AB 1 CT1 36 (SUTURE) ×3 IMPLANT
SYR 20CC LL (SYRINGE) ×3 IMPLANT
SYR 30ML LL (SYRINGE) ×3 IMPLANT
SYR 5ML 18GX1 1/2 (NEEDLE) ×2 IMPLANT
TAPE MICROFOAM 4IN (TAPE) ×3 IMPLANT
TOWEL OR 17X26 4PK STRL BLUE (TOWEL DISPOSABLE) ×3 IMPLANT
WND VAC CANISTER 500ML (MISCELLANEOUS) ×3 IMPLANT

## 2016-12-09 NOTE — ED Triage Notes (Signed)
Pt coming from home for fall and complain of right upper leg pain after tripping in the dark due to no power on a dog pillow patietn has hx  of pancretic cancer treatment at [facility]. 75 mcg of Fentayl given in increments of 25 by EMS. Patient complains of pain still 6/10 and holding leg

## 2016-12-09 NOTE — Transfer of Care (Signed)
Immediate Anesthesia Transfer of Care Note  Patient: Chelsey Watkins  Procedure(s) Performed: ANTERIOR APPROACH HEMI HIP ARTHROPLASTY (Right Hip)  Patient Location: PACU  Anesthesia Type:General  Level of Consciousness: drowsy and patient cooperative  Airway & Oxygen Therapy: Patient Spontanous Breathing and Patient connected to face mask oxygen  Post-op Assessment: Report given to RN and Post -op Vital signs reviewed and stable  Post vital signs: Reviewed and stable  Last Vitals:  Vitals:   12/09/16 0622 12/09/16 1050  BP: 139/70 (!) 156/82  Pulse: 80 89  Resp:  18  Temp: 37.1 C 37.2 C  SpO2: 93% 99%    Last Pain:  Vitals:   12/09/16 1050  TempSrc: Tympanic  PainSc: 3          Complications: No apparent anesthesia complications

## 2016-12-09 NOTE — Progress Notes (Signed)
Admitted for right hip fracture, patient is scheduled for right hip fracture repair by orthopedic. Continue nothing by mouth, IV fluids, IV pain medicines, perioperative antibiotics. Patient has history of pancreatic cancer scheduled to get chemotherapy today ,will inform  [facility] oncology. 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 Patient agreed for it. Time spent 20 minutes.

## 2016-12-09 NOTE — H&P (Signed)
[place] [facility] [place] NAME: Chelsey Watkins    MR#:  [id number]  DATE OF BIRTH:  May 12, 1945  DATE OF ADMISSION:  12/09/2016  PRIMARY CARE PHYSICIAN: Gayland Curry, MD   REQUESTING/REFERRING PHYSICIAN:   CHIEF COMPLAINT:   Chief Complaint  Patient presents with  . Fall  . Leg Pain    HISTORY OF PRESENT ILLNESS: Chelsey Watkins  is a 71 y.o. female with a known history of Pancreatic cancer, nephrolithiasis presented to the emergency room with fall. Patient lost balance yesterday at home and fell down and landed on her right hip. She has pain in the right hip and right thigh. The pain is aching in nature 6 out of 10 on a scale of 1-10. No history of loss of consciousness, head injury. Patient was evaluated in the emergency room with x-ray of the hip which shows a right femoral neck fracture. Orthopedic surgery was consulted by ER physician. Hospitalist service was consulted for further care for admission.  PAST MEDICAL HISTORY:   Past Medical History:  Diagnosis Date  . Cancer ([place])    skin ca  . Kidney stones   . Pancreatic cancer ([place])     PAST SURGICAL HISTORY:  Past Surgical History:  Procedure Laterality Date  . none      SOCIAL HISTORY:  Social History  Substance Use Topics  . Smoking status: Never Smoker  . Smokeless tobacco: Former Systems developer  . Alcohol use No    FAMILY HISTORY:  Family History  Problem Relation Age of Onset  . Breast cancer Paternal Grandmother 52  . Hypertension Neg Hx   . Diabetes Mellitus II Neg Hx     DRUG ALLERGIES: No Known Allergies  REVIEW OF SYSTEMS:   CONSTITUTIONAL: No fever, fatigue or weakness.  EYES: No blurred or double vision.  EARS, NOSE, AND THROAT: No tinnitus or ear pain.  RESPIRATORY: No cough, shortness of breath, wheezing or hemoptysis.  CARDIOVASCULAR: No chest pain, orthopnea, edema.  GASTROINTESTINAL: No nausea, vomiting, diarrhea or abdominal pain.   GENITOURINARY: No dysuria, hematuria.  ENDOCRINE: No polyuria, nocturia,  HEMATOLOGY: No anemia, easy bruising or bleeding SKIN: No rash or lesion. MUSCULOSKELETAL: has right hip pain  NEUROLOGIC: No tingling, numbness, weakness.  PSYCHIATRY: No anxiety or depression.   MEDICATIONS AT HOME:  Prior to Admission medications   Medication Sig Start Date End Date Taking? Authorizing Provider  acetaminophen (TYLENOL) 500 MG tablet Take by mouth.   Yes [provider]  dexamethasone (DECADRON) 4 MG tablet Take 2 tablets by mouth daily. 11/29/16  Yes [provider]  MAGNESIUM-OXIDE 400 (241.3 Mg) MG tablet Take 1 tablet by mouth 2 (two) times daily. 10/25/16  Yes [provider]  oxyCODONE (OXY IR/ROXICODONE) 5 MG immediate release tablet Take 3 tablets by mouth every 4 (four) hours while awake. 12/01/16  Yes [provider]  polyethylene glycol (MIRALAX / GLYCOLAX) packet Take by mouth.   Yes [provider]  potassium chloride SA (K-DUR,KLOR-CON) 20 MEQ tablet Take 1 tablet by mouth daily. 10/25/16  Yes [provider]  sulfamethoxazole-trimethoprim (BACTRIM DS,SEPTRA DS) 800-160 MG tablet Take 1 tablet by mouth 2 (two) times daily. 11/30/16  Yes [provider]  zolpidem (AMBIEN) 10 MG tablet Take 1 tablet by mouth at bedtime as needed. 10/26/16  Yes [provider]  baclofen (LIORESAL) 10 MG tablet Take 1 tablet (10 mg total) by mouth 3 (three) times daily. Patient not taking: Reported on  12/09/2016 08/13/16   Triplett, Johnette Abraham B, FNP  lidocaine-prilocaine (EMLA) cream Apply topically. 05/19/16   [provider]  ondansetron (ZOFRAN) 8 MG tablet Take by mouth. 05/17/16   [provider]  prochlorperazine (COMPAZINE) 10 MG tablet Take by mouth. 05/17/16   [provider]      PHYSICAL EXAMINATION:   VITAL SIGNS: Blood pressure 135/71, temperature 98.2 F (36.8 C), weight 40.8 kg (90 lb), SpO2 100  %.  GENERAL:  71 y.o.-year-old patient lying in the bed with no acute distress.  EYES: Pupils equal, round, reactive to light and accommodation. No scleral icterus. Extraocular muscles intact.  HEENT: Head atraumatic, normocephalic. Oropharynx and nasopharynx clear.  NECK:  Supple, no jugular venous distention. No thyroid enlargement, no tenderness.  LUNGS: Normal breath sounds bilaterally, no wheezing, rales,rhonchi or crepitation. No use of accessory muscles of respiration.  CARDIOVASCULAR: S1, S2 normal. No murmurs, rubs, or gallops.  ABDOMEN: Soft, nontender, nondistended. Bowel sounds present. No organomegaly or mass.  EXTREMITIES: No pedal edema, cyanosis, or clubbing.  Tenderness right hip and right groin area NEUROLOGIC: Cranial nerves II through XII are intact. Muscle strength 5/5 in all extremities. Sensation intact. Gait not checked.  PSYCHIATRIC: The patient is alert and oriented x 3.  SKIN: No obvious rash, lesion, or ulcer.   LABORATORY PANEL:   CBC No results for input(s): WBC, HGB, HCT, PLT, MCV, MCH, MCHC, RDW, LYMPHSABS, MONOABS, EOSABS, BASOSABS, BANDABS in the last 168 hours.  Invalid input(s): NEUTRABS, BANDSABD ------------------------------------------------------------------------------------------------------------------  Chemistries  No results for input(s): NA, K, CL, CO2, GLUCOSE, BUN, CREATININE, CALCIUM, MG, AST, ALT, ALKPHOS, BILITOT in the last 168 hours.  Invalid input(s): GFRCGP ------------------------------------------------------------------------------------------------------------------ CrCl cannot be calculated (No order found.). ------------------------------------------------------------------------------------------------------------------ No results for input(s): TSH, T4TOTAL, T3FREE, THYROIDAB in the last 72 hours.  Invalid input(s): FREET3   Coagulation profile No results for input(s): INR, PROTIME in the last 168  hours. ------------------------------------------------------------------------------------------------------------------- No results for input(s): DDIMER in the last 72 hours. -------------------------------------------------------------------------------------------------------------------  Cardiac Enzymes No results for input(s): CKMB, TROPONINI, MYOGLOBIN in the last 168 hours.  Invalid input(s): CK ------------------------------------------------------------------------------------------------------------------ Invalid input(s): POCBNP  ---------------------------------------------------------------------------------------------------------------  Urinalysis No results found for: COLORURINE, APPEARANCEUR, LABSPEC, PHURINE, GLUCOSEU, HGBUR, BILIRUBINUR, KETONESUR, PROTEINUR, UROBILINOGEN, NITRITE, LEUKOCYTESUR   RADIOLOGY: Dg Chest Portable 1 View  Result Date: 12/09/2016 CLINICAL DATA:  Status post fall, with concern for chest injury. Initial encounter. EXAM: PORTABLE CHEST 1 VIEW COMPARISON:  None. FINDINGS: The lungs are well-aerated. Minimal scarring is noted at the right midlung zone. There is no evidence of focal opacification, pleural effusion or pneumothorax. The cardiomediastinal silhouette is within normal limits. No acute osseous abnormalities are seen. A right-sided chest port is noted ending about the proximal SVC. IMPRESSION: No acute cardiopulmonary process seen. Electronically Signed   By: Garald Balding M.D.   On: 12/09/2016 01:49   Dg Hip Unilat W Or Wo Pelvis 2-3 Views Right  Result Date: 12/09/2016 CLINICAL DATA:  Status post fall, with right hip pain. Initial encounter. EXAM: DG HIP (WITH OR WITHOUT PELVIS) 2-3V RIGHT COMPARISON:  None. FINDINGS: There is a displaced subcapital fracture of the right femoral neck, with medial displacement of the femoral head. Both femoral heads are seated normally within their respective acetabula. No significant degenerative change  is appreciated. The sacroiliac joints are unremarkable in appearance. The visualized bowel gas pattern is grossly unremarkable in appearance. IMPRESSION: Displaced subcapital fracture of the right femoral neck. Electronically Signed   By: Garald Balding M.D.   On:  12/09/2016 01:48    EKG: No orders found for this or any previous visit.  IMPRESSION AND PLAN: 71 year old female patient with history of pancreatic cancer, nephrolithiasis presented to the emergency room with fall and right hip pain.  Admitting diagnosis 1. Right femoral neck fracture 2. Accidental fall 3. Right hip pain 4. History of pancreatic cancer Treatment plan Admit patient to medical floor Nothing by mouth Orthopedic surgery consultation IV fluids Pain management with IV Toradol as needed and oral Percocet Physical therapy evaluation after surgery  All the records are reviewed and case discussed with ED provider. Management plans discussed with the patient, family and they are in agreement.  CODE STATUS:FULL CODE Code Status History    This patient does not have a recorded code status. Please follow your organizational policy for patients in this situation.       TOTAL TIME TAKING CARE OF THIS PATIENT: 50 minutes.    Saundra Shelling M.D on 12/09/2016 at 5:25 AM  Between 7am to 6pm - Pager - [phone]  After 6pm go to [email or web address] - password [email or web address] [facility]  [facility]  Office  [phone]  CC: Primary care physician; Gayland Curry, MD

## 2016-12-09 NOTE — Op Note (Signed)
12/09/2016  1:25 PM  PATIENT:  Chelsey Watkins  71 y.o. female  PRE-OPERATIVE DIAGNOSIS:  FEMORAL NECK FRACTURE right  POST-OPERATIVE DIAGNOSIS:  FEMORAL NECK FRACTURE right  PROCEDURE:  Procedure(s): ANTERIOR APPROACH HEMI HIP ARTHROPLASTY (Right)  SURGEON: Laurene Footman, MD  ASSISTANTS: None  ANESTHESIA:   general  EBL:  Total I/O In: 1000 [I.V.:1000] Out: 450 [Urine:100; Blood:350]  BLOOD ADMINISTERED:none  DRAINS: none   LOCAL MEDICATIONS USED:  MARCAINE     SPECIMEN:  Source of Specimen:  Right femoral head  DISPOSITION OF SPECIMEN:  PATHOLOGY  COUNTS:  YES  TOURNIQUET:  * No tourniquets in log *  IMPLANTS: [place] for standard stem with 43 mm bipolar head and S 22 mm head  DICTATION: .Dragon Dictation   The patient was brought to the operating room and after general anesthesia was obtained patient was placed on the operative table with the ipsilateral foot into the Medacta attachment, contralateral leg on a well-padded table. C-arm was brought in and preop template x-ray taken. After prepping and draping in usual sterile fashion appropriate patient identification and timeout procedures were completed. Anterior approach to the hip was obtained and centered over the greater trochanter and TFL muscle. The subcutaneous tissue was incised hemostasis being achieved by electrocautery. TFL fascia was incised and the muscle retracted laterally deep retractor placed. The lateral femoral circumflex vessels were identified and ligated. The anterior capsule was exposed and a capsulotomy performed. The neck was identified and a femoral neck cut carried out with a saw. The head was removed and the neck did not have any evidence of disease, benign in appearance other than the fracture.. 43 mm cup trial gave appropriate tightness to the acetabular component a 43 mm bipolar head was obtained. The leg was then externally rotated and ischiofemoral and publofemoral releases carried  out. The femur was sequentially broached to a size 4 and the final components chosen. The 4 standard stem was inserted along with a S 22 mm head and 43 bipolar mm liner. The hip was reduced and was stable the wound was thoroughly irrigated with fibrillar placed along the posterior capsular release and medial neck. The deep fascia was closed using a heavy Quill after infiltration of 30 cc of quarter percent Sensorcaine with epinephrine. 3-0 v-loc to close the skin with skin staples followed by incisional wound VAC  PLAN OF CARE: Into new as inpatient

## 2016-12-09 NOTE — Anesthesia Procedure Notes (Signed)
Procedure Name: LMA Insertion Date/Time: 12/09/2016 11:59 AM Performed by: Silvana Newness Pre-anesthesia Checklist: Patient identified, Emergency Drugs available, Suction available, Patient being monitored and Timeout performed Patient Re-evaluated:Patient Re-evaluated prior to induction Oxygen Delivery Method: Circle system utilized Preoxygenation: Pre-oxygenation with 100% oxygen Induction Type: IV induction Ventilation: Mask ventilation without difficulty LMA: LMA inserted LMA Size: 4.0 Number of attempts: 1 Placement Confirmation: positive ETCO2 and breath sounds checked- equal and bilateral Tube secured with: Tape Dental Injury: Teeth and Oropharynx as per pre-operative assessment

## 2016-12-09 NOTE — Progress Notes (Signed)
Initial Nutrition Assessment  DOCUMENTATION CODES:   Severe malnutrition in context of chronic illness  INTERVENTION:  1. Recommend Ensure Enlive po BID, each supplement provides 350 kcal and 20 grams of protein w/ diet advancement  NUTRITION DIAGNOSIS:   Malnutrition related to chronic illness as evidenced by severe depletion of muscle mass, severe depletion of body fat.  GOAL:   Patient will meet greater than or equal to 90% of their needs  MONITOR:   PO intake, I & O's, Labs, Weight trends, Supplement acceptance  REASON FOR ASSESSMENT:   Malnutrition Screening Tool    ASSESSMENT:   Chelsey Watkins  is a 71 y.o. female with a known history of Pancreatic cancer, nephrolithiasis presented to the emergency room with fall. Patient lost balance yesterday at home and fell down and landed on her right hip. Admitted with Hip fx.  Spoke with patient, patient's husband at bedside. Husband provides most oh history, patient very sleepy following surgery. Patient is now s/p anterior approach hemi hip arthoplasty. She also has stg III pancreatic cancer currently being considered for surgery and started her 12th cycle of chemo consisting of eloxatin, comptosar, and adrucil. Patient normally eats well, an omelet with bacon, toast, and orange juice for breakfast, normally skips lunch, for dinner will eat roast beef or pinto beans with sides. Husband states "she eats better than me." He denies any weight loss recently. No issues chewing/swallowing/choking. No current nausea/vomiting though patient did have some a week ago during her visit to her oncologist. Seems that has resolved.  Nutrition-Focused physical exam completed. Findings are severe fat depletion, severe muscle depletion, and no edema.   Labs reviewed Medications reviewed and include:  Colace, Decadron, 20K+ daily, Miralax, Mag-Ox NS at 166mL/hr D5 NS at 13mL/hr --> 306 calories  . Diet Order:  Diet clear liquid Room service  appropriate? Yes; Fluid consistency: Thin  Skin:  Wound (see comment) (Closed incision to hip)  Last BM:  PTA  Height:   Ht Readings from Last 1 Encounters:  12/09/16 5\' 2"  (1.575 m)    Weight:   Wt Readings from Last 1 Encounters:  12/09/16 90 lb (40.8 kg)    Ideal Body Weight:  50 kg  BMI:  Body mass index is 16.46 kg/m.  Estimated Nutritional Needs:   Kcal:  1250-1400 calories  Protein:  61-70 grams  Fluid:  >1.5L  EDUCATION NEEDS:   Education needs no appropriate at this time  Satira Anis. Ishaan Villamar, MS, RD LDN Inpatient Clinical Dietitian Pager [phone]

## 2016-12-09 NOTE — Care Management (Signed)
RNCM consult received and will follow for home health PT. PT pending. Patient post operative today.

## 2016-12-09 NOTE — Anesthesia Post-op Follow-up Note (Signed)
Anesthesia QCDR form completed.        

## 2016-12-09 NOTE — Anesthesia Preprocedure Evaluation (Addendum)
Anesthesia Evaluation  Patient identified by MRN, date of birth, ID band Patient awake    Reviewed: Allergy & Precautions, NPO status , Patient's Chart, lab work & pertinent test results, reviewed documented beta blocker date and time   History of Anesthesia Complications Negative for: history of anesthetic complications  Airway Mallampati: II  TM Distance: >3 FB     Dental  (+) Chipped   Pulmonary neg sleep apnea, neg COPD,           Cardiovascular (-) hypertension(-) Past MI and (-) CHF (-) dysrhythmias (-) Valvular Problems/Murmurs     Neuro/Psych neg Seizures    GI/Hepatic Neg liver ROS, neg GERD  ,Pancreatic cancer.   Endo/Other  neg diabetes  Renal/GU Renal disease (stones)     Musculoskeletal   Abdominal   Peds  Hematology   Anesthesia Other Findings Hb 7.9. She has been anemic.  Reproductive/Obstetrics                            Anesthesia Physical Anesthesia Plan  ASA: III  Anesthesia Plan: General   Post-op Pain Management:    Induction: Intravenous  PONV Risk Score and Plan:   Airway Management Planned: LMA  Additional Equipment:   Intra-op Plan:   Post-operative Plan:   Informed Consent: I have reviewed the patients History and Physical, chart, labs and discussed the procedure including the risks, benefits and alternatives for the proposed anesthesia with the patient or authorized representative who has indicated his/her understanding and acceptance.     Plan Discussed with: CRNA  Anesthesia Plan Comments:        Anesthesia Quick Evaluation

## 2016-12-09 NOTE — NC FL2 (Signed)
[place] LEVEL OF CARE SCREENING TOOL     IDENTIFICATION  Patient Name: Chelsey Watkins Birthdate: 1945/04/12 Sex: female Admission Date (Current Location): 12/09/2016  [place] and [place] Number:  Engineering geologist and Address:  [facility], [address], [place], [place] [address]      Provider Number: [id number]  Attending Physician Name and Address:  Epifanio Lesches, MD  Relative Name and Phone Number:       Current Level of Care: Hospital Recommended Level of Care: [place] Prior Approval Number:    Date Approved/Denied:   PASRR Number:  [id number])  Discharge Plan: SNF    Current Diagnoses: Patient Active Problem List   Diagnosis Date Noted  . Hip fracture ([place]) 12/09/2016  . HLD (hyperlipidemia) 08/01/2014  . Calculus of kidney 08/01/2014  . Osteopenia 08/01/2014  . Post menopausal syndrome 08/01/2014  . Avitaminosis D 08/01/2014    Orientation RESPIRATION BLADDER Height & Weight     Self, Time, Situation, Place  Normal Continent Weight: 90 lb (40.8 kg) Height:     BEHAVIORAL SYMPTOMS/MOOD NEUROLOGICAL BOWEL NUTRITION STATUS      Continent Diet (NPO for surgery to be advanced. )  AMBULATORY STATUS COMMUNICATION OF NEEDS Skin   Extensive Assist Verbally Surgical wounds                       Personal Care Assistance Level of Assistance  Bathing, Feeding, Dressing Bathing Assistance: Limited assistance Feeding assistance: Independent Dressing Assistance: Limited assistance     Functional Limitations Info  Sight, Hearing, Speech Sight Info: Adequate Hearing Info: Adequate Speech Info: Adequate    SPECIAL CARE FACTORS FREQUENCY  PT (By licensed PT), OT (By licensed OT)     PT Frequency:  (5) OT Frequency:  (5)            Contractures      Additional Factors Info  Code Status, Allergies Code Status Info:  (Full Code. ) Allergies Info:  (No Known Allergies. )            Current Medications (12/09/2016):  This is the current hospital active medication list Current Facility-Administered Medications  Medication Dose Route Frequency Provider Last Rate Last Dose  . acetaminophen (TYLENOL) tablet 650 mg  650 mg Oral Q6H PRN Saundra Shelling, MD       Or  . acetaminophen (TYLENOL) suppository 650 mg  650 mg Rectal Q6H PRN Pyreddy, Reatha Harps, MD      . baclofen (LIORESAL) tablet 10 mg  10 mg Oral TID Saundra Shelling, MD      . ceFAZolin (ANCEF) IVPB 1 g/50 mL premix  1 g Intravenous Once Hessie Knows, MD      . dextrose 5 %-0.9 % sodium chloride infusion   Intravenous Continuous Saundra Shelling, MD 75 mL/hr at 12/09/16 0631    . famotidine (PEPCID) tablet 20 mg  20 mg Oral Once Gunnar Bulla, MD      . HYDROcodone-acetaminophen (NORCO/VICODIN) 5-325 MG per tablet 1-2 tablet  1-2 tablet Oral Q4H PRN Pyreddy, Reatha Harps, MD      . ketorolac (TORADOL) 15 MG/ML injection 15 mg  15 mg Intravenous Q6H PRN Pyreddy, Pavan, MD      . lactated ringers infusion   Intravenous Continuous Gunnar Bulla, MD      . ondansetron [facility]) tablet 4 mg  4 mg Oral Q6H PRN Saundra Shelling, MD       Or  . ondansetron (  ZOFRAN) injection 4 mg  4 mg Intravenous Q6H PRN Pyreddy, Pavan, MD      . senna-docusate (Senokot-S) tablet 1 tablet  1 tablet Oral QHS PRN Saundra Shelling, MD         Discharge Medications: Please see discharge summary for a list of discharge medications.  Relevant Imaging Results:  Relevant Lab Results:   Additional Information  (SSN: [id number] (patient goes to [facility] for cancer treatment twice per month for infusions) )  Secilia Apps, Veronia Beets, LCSW

## 2016-12-09 NOTE — Consult Note (Signed)
71 year old suffered a fall at home last night. She is normally an ambulator without assisted device but has been having trouble with activity secondary to pancreatic cancer and receiving chemotherapy. He has been able to continue to drive and takes care of herself.  On exam she has shortening and external rotation of the right leg and intact pulses to the right foot. Sensation intact to the foot. X-rays reveal completely displaced subcapital femoral neck fracture.  Impression is displaced subcapital hip fracture  Plan is for right hip hemiarthroplasty. We'll give perioperative antibiotics and postoperative anticoagulation. We'll notify her oncologist at [facility] that she is here at the hospital and if he has a recommendations regarding her chemotherapy or other treatment

## 2016-12-09 NOTE — ED Provider Notes (Signed)
[facility] Emergency Department Provider Note   First MD Initiated Contact with Patient 12/09/16 0104     (approximate)  I have reviewed the triage vital signs and the nursing notes.   HISTORY  Chief Complaint Fall and Leg Pain   HPI Chelsey Watkins is a 71 y.o. female with below list of chronic medical conditions including pancreatic cancer receiving chemotherapy [facility] presents to the emergency department with history of accidental trip and fall tonight with resultant 10 out of 10 right hip pain that is worse with any movement. Patient denies any head injury no loss of consciousness.   Past Medical History:  Diagnosis Date  . Cancer ([place])    skin ca  . Kidney stones   . Pancreatic cancer [facility])     Patient Active Problem List   Diagnosis Date Noted  . Hip fracture ([place]) 12/09/2016  . HLD (hyperlipidemia) 08/01/2014  . Calculus of kidney 08/01/2014  . Osteopenia 08/01/2014  . Post menopausal syndrome 08/01/2014  . Avitaminosis D 08/01/2014    Past Surgical History:  Procedure Laterality Date  . none      Prior to Admission medications   Medication Sig Start Date End Date Taking? Authorizing Provider  acetaminophen (TYLENOL) 500 MG tablet Take by mouth.   Yes [provider]  dexamethasone (DECADRON) 4 MG tablet Take 2 tablets by mouth daily. 11/29/16  Yes [provider]  MAGNESIUM-OXIDE 400 (241.3 Mg) MG tablet Take 1 tablet by mouth 2 (two) times daily. 10/25/16  Yes [provider]  oxyCODONE (OXY IR/ROXICODONE) 5 MG immediate release tablet Take 3 tablets by mouth every 4 (four) hours while awake. 12/01/16  Yes [provider]  polyethylene glycol (MIRALAX / GLYCOLAX) packet Take by mouth.   Yes [provider]  potassium chloride SA (K-DUR,KLOR-CON) 20 MEQ tablet Take 1 tablet by mouth daily. 10/25/16  Yes [provider]  sulfamethoxazole-trimethoprim (BACTRIM DS,SEPTRA DS)  800-160 MG tablet Take 1 tablet by mouth 2 (two) times daily. 11/30/16  Yes [provider]  zolpidem (AMBIEN) 10 MG tablet Take 1 tablet by mouth at bedtime as needed. 10/26/16  Yes [provider]  baclofen (LIORESAL) 10 MG tablet Take 1 tablet (10 mg total) by mouth 3 (three) times daily. Patient not taking: Reported on 12/09/2016 08/13/16   Sherrie George B, FNP  lidocaine-prilocaine (EMLA) cream Apply topically. 05/19/16   [provider]  ondansetron (ZOFRAN) 8 MG tablet Take by mouth. 05/17/16   [provider]  prochlorperazine (COMPAZINE) 10 MG tablet Take by mouth. 05/17/16   [provider]    Allergies no known drug allergies  Family History  Problem Relation Age of Onset  . Breast cancer Paternal Grandmother 37  . Hypertension Neg Hx   . Diabetes Mellitus II Neg Hx     Social History Social History  Substance Use Topics  . Smoking status: Never Smoker  . Smokeless tobacco: Former Systems developer  . Alcohol use No    Review of Systems Constitutional: No fever/chills Eyes: No visual changes. ENT: No sore throat. Cardiovascular: Denies chest pain. Respiratory: Denies shortness of breath. Gastrointestinal: No abdominal pain.  No nausea, no vomiting.  No diarrhea.  No constipation. Genitourinary: Negative for dysuria. Musculoskeletal: Negative for neck pain.  Negative for back pain.Positive for right hip pain Integumentary: Negative for rash. Neurological: Negative for headaches, focal weakness or numbness.  ____________________________________________   PHYSICAL EXAM:  VITAL SIGNS: ED Triage Vitals  Enc Vitals  Group     BP 12/09/16 0104 (!) 157/67     Pulse --      Resp --      Temp 12/09/16 0104 98.2 F (36.8 C)     Temp src --      SpO2 12/09/16 0104 100 %     Weight 12/09/16 0105 40.8 kg (90 lb)     Height --      Head Circumference --      Peak Flow --      Pain Score 12/09/16 0421 Asleep     Pain Loc --      Pain  Edu? --      Excl. in [place]? --     Constitutional: Alert and oriented. apparent discomfort Eyes: Conjunctivae are normal. PERRL. EOMI. Head: Atraumatic. Mouth/Throat: Mucous membranes are moist.  Oropharynx non-erythematous. Neck: No stridor. Cardiovascular: Normal rate, regular rhythm. Good peripheral circulation. Grossly normal heart sounds. Respiratory: Normal respiratory effort.  No retractions. Lungs CTAB. Gastrointestinal: Soft and nontender. No distention.  Musculoskeletal: right hip pain with palpation. Leg length shortening on the right Neurologic:  Normal speech and language. No gross focal neurologic deficits are appreciated.  Skin:  Skin is warm, dry and intact. No rash noted. Psychiatric: Mood and affect are normal. Speech and behavior are normal.    RADIOLOGY I, [place], personally viewed and evaluated these images (plain radiographs) as part of my medical decision making, as well as reviewing the written report by the radiologist.  Dg Chest Portable 1 View  Result Date: 12/09/2016 CLINICAL DATA:  Status post fall, with concern for chest injury. Initial encounter. EXAM: PORTABLE CHEST 1 VIEW COMPARISON:  None. FINDINGS: The lungs are well-aerated. Minimal scarring is noted at the right midlung zone. There is no evidence of focal opacification, pleural effusion or pneumothorax. The cardiomediastinal silhouette is within normal limits. No acute osseous abnormalities are seen. A right-sided chest port is noted ending about the proximal SVC. IMPRESSION: No acute cardiopulmonary process seen. Electronically Signed   By: Garald Balding M.D.   On: 12/09/2016 01:49   Dg Hip Unilat W Or Wo Pelvis 2-3 Views Right  Result Date: 12/09/2016 CLINICAL DATA:  Status post fall, with right hip pain. Initial encounter. EXAM: DG HIP (WITH OR WITHOUT PELVIS) 2-3V RIGHT COMPARISON:  None. FINDINGS: There is a displaced subcapital fracture of the right femoral neck, with medial  displacement of the femoral head. Both femoral heads are seated normally within their respective acetabula. No significant degenerative change is appreciated. The sacroiliac joints are unremarkable in appearance. The visualized bowel gas pattern is grossly unremarkable in appearance. IMPRESSION: Displaced subcapital fracture of the right femoral neck. Electronically Signed   By: Garald Balding M.D.   On: 12/09/2016 01:48      Procedures   ____________________________________________   INITIAL IMPRESSION / ASSESSMENT AND PLAN / ED COURSE  As part of my medical decision making, I reviewed the following data within the electronic MEDICAL RECORD [id number] year old female presenting with above stated history and physical exam concerning for possible right hip fracture which was confirmed on x-ray which revealed a right subcapital closed hip fracture. As such patient discussed with Dr. Jabier Gauss surgeon on call who will consult on the patient. Patient discussed with Dr. Estanislado Pandy hospitals on call who will admit the patient. patient received multiple doses of IV morphine for pain in the emergency Department with improvement of discomfort. Patient was made aware of all clinical findings by me.  ____________________________________________  FINAL CLINICAL IMPRESSION(S) / ED DIAGNOSES  Final diagnoses:  Subcapital fracture of hip, right, closed, initial encounter ([place])     MEDICATIONS GIVEN DURING THIS VISIT:  Medications  HYDROcodone-acetaminophen (NORCO/VICODIN) 5-325 MG per tablet 1-2 tablet (not administered)  ondansetron (ZOFRAN) injection 4 mg (4 mg Intravenous Given 12/09/16 0110)  morphine 4 MG/ML injection 4 mg (4 mg Intravenous Given 12/09/16 0112)  LORazepam (ATIVAN) injection 1 mg (1 mg Intravenous Given 12/09/16 0220)  sodium chloride 0.9 % bolus 1,000 mL (0 mLs Intravenous Stopped 12/09/16 0516)  morphine 4 MG/ML injection 4 mg (4 mg Intravenous Given 12/09/16 0348)     NEW  OUTPATIENT MEDICATIONS STARTED DURING THIS VISIT:  New Prescriptions   No medications on file    Modified Medications   No medications on file    Discontinued Medications   LIDOCAINE (LIDODERM) 5 %    Place 1 patch onto the skin every 12 (twelve) hours. Remove & Discard patch within 12 hours or as directed by MD     Note:  This document was prepared using Dragon voice recognition software and may include unintentional dictation errors.    Gregor Hams, MD 12/09/16 [phone]

## 2016-12-09 NOTE — Clinical Social Work Note (Signed)
Clinical Social Work Assessment  Patient Details  Name: Chelsey Watkins MRN: [id number] Date of Birth: 1946/02/09  Date of referral:  12/09/16               Reason for consult:  Facility Placement                Permission sought to share information with:  Chartered certified accountant granted to share information::  Yes, Verbal Permission Granted  Name::      [place]::   [place]  Relationship::     Contact Information:     Housing/Transportation Living arrangements for the past 2 months:  [place] of Information:  Patient Patient Interpreter Needed:  None Criminal Activity/Legal Involvement Pertinent to Current Situation/Hospitalization:  No - Comment as needed Significant Relationships:  Siblings, Friend Lives with:  Roommate Do you feel safe going back to the place where you live?  Yes Need for family participation in patient care:  Yes (Comment)  Care giving concerns:  Patient lives in [place] with her friend Jenny Reichmann.    Social Worker assessment / plan:  Holiday representative (CSW) received verbal consult from Ortho MD that patient has a hip fracture and will have surgery today around 12 noon. Per Ortho MD patient goes to [facility] for cancer treatments. CSW met with patient alone at bedside prior to surgery today. Patient was alert and oriented X4 and was laying in the bed. CSW introduced self and explained role of CSW department. Patient at first said she lives alone and then later on during assessment stated that her friend Jenny Reichmann lives with her. Patient reported that her adult children live far away and are not involved with her daily life. Per patient she has no HPOA. Patient reported that she drives herself to [facility] for her cancer treatment infusions twice per month. CSW explained that after surgery PT will evaluate patient and make a recommendation of home health or SNF. CSW explained that if patient needs SNF [facility] will  have to approve it and we will have to find a SNF that can transport her to [facility] for her infusions. Patient verbalized her understanding and reported that she prefers to go to SNF. CSW provided patient with a SNF list. FL2 complete and faxed out. CSW will continue to follow and assist as needed.   Employment status:  Retired Nurse, adult PT Recommendations:  Not assessed at this time [place] / Referral to community resources:  [place]  Patient/Family's Response to care:  Patient prefers SNF and is agreeable to [facility] in [place].   Patient/Family's Understanding of and Emotional Response to Diagnosis, Current Treatment, and Prognosis:  Patient was very pleasant and thanked CSW for assistance.   Emotional Assessment Appearance:  Appears stated age Attitude/Demeanor/Rapport:    Affect (typically observed):  Accepting, Adaptable, Pleasant Orientation:  Oriented to Self, Oriented to Place, Oriented to  Time, Oriented to Situation Alcohol / Substance use:  Not Applicable Psych involvement (Current and /or in the community):  No (Comment)  Discharge Needs  Concerns to be addressed:  Discharge Planning Concerns Readmission within the last 30 days:  No Current discharge risk:  Dependent with Mobility Barriers to Discharge:  Continued Medical Work up   [employer], Veronia Beets, LCSW 12/09/2016, 9:33 AM

## 2016-12-09 NOTE — Clinical Social Work Placement (Signed)
   CLINICAL SOCIAL WORK PLACEMENT  NOTE  Date:  12/09/2016  Patient Details  Name: Chelsey Watkins MRN: [id number] Date of Birth: 1945-08-25  Clinical Social Work is seeking post-discharge placement for this patient at the [place] level of care (*CSW will initial, date and re-position this form in  chart as items are completed):  Yes   Patient/family provided with [place] Work Department's list of facilities offering this level of care within the geographic area requested by the patient (or if unable, by the patient's family).  Yes   Patient/family informed of their freedom to choose among providers that offer the needed level of care, that participate in Medicare, Medicaid or managed care program needed by the patient, have an available bed and are willing to accept the patient.  Yes   Patient/family informed of [facility]'s ownership interest in [facility] and [facility], as well as of the fact that they are under no obligation to receive care at these facilities.  PASRR submitted to EDS on 12/09/16     PASRR number received on 12/09/16     Existing PASRR number confirmed on       FL2 transmitted to all facilities in geographic area requested by pt/family on 12/09/16     FL2 transmitted to all facilities within larger geographic area on       Patient informed that his/her managed care company has contracts with or will negotiate with certain facilities, including the following:            Patient/family informed of bed offers received.  Patient chooses bed at       Physician recommends and patient chooses bed at      Patient to be transferred to   on  .  Patient to be transferred to facility by       Patient family notified on   of transfer.  Name of family member notified:        PHYSICIAN       Additional Comment:    _______________________________________________ [name], Veronia Beets, LCSW 12/09/2016, 9:32 AM

## 2016-12-09 NOTE — ED Notes (Signed)
Called report to [facility] on 1A. Was informed that room still in the process of being cleaned, and that he call ED when room is clean and ready for patient.

## 2016-12-10 LAB — BASIC METABOLIC PANEL
Anion gap: 5 (ref 5–15)
BUN: 10 mg/dL (ref 6–20)
CHLORIDE: 105 mmol/L (ref 101–111)
CO2: 25 mmol/L (ref 22–32)
CREATININE: 0.54 mg/dL (ref 0.44–1.00)
Calcium: 7.8 mg/dL — ABNORMAL LOW (ref 8.9–10.3)
GFR calc Af Amer: >60 mL/min (ref >60)
Glucose, Bld: 239 mg/dL — ABNORMAL HIGH (ref 65–99)
Potassium: 4.4 mmol/L (ref 3.5–5.1)
SODIUM: 135 mmol/L (ref 135–145)

## 2016-12-10 NOTE — Evaluation (Signed)
Physical Therapy Evaluation Patient Details Name: Chelsey Watkins MRN: [id number] DOB: Jul 02, 1945 Today's Date: 12/10/2016   History of Present Illness  71 y/o female here after fall with hip fx. had total hip replacement (anterior approach)  Clinical Impression  Pt did very well with first post-op PT session, she was able to do mobility and transfers w/o issue and though she needed some minimal cuing and gait training she ultimately was able to circumambulate the nurses station with good safety and confidence and showed exceptional mobility for POD1 activity.  Pt should be able to go home with HHPT w/o issue.    Follow Up Recommendations Home health PT    Equipment Recommendations       Recommendations for Other Services       Precautions / Restrictions Precautions Precautions: Anterior Hip;Fall Restrictions Weight Bearing Restrictions: Yes RLE Weight Bearing: Weight bearing as tolerated      Mobility  Bed Mobility Overal bed mobility: Independent             General bed mobility comments: Pt is able to get to EOB and sit confidently w/o issue  Transfers Overall transfer level: Independent Equipment used: Rolling walker (2 wheeled)             General transfer comment: Pt was able to stand with minimal UE use, good balance with and w/o AD  Ambulation/Gait Ambulation/Gait assistance: Supervision Ambulation Distance (Feet): 200 Feet Assistive device: Rolling walker (2 wheeled)       General Gait Details: Pt walked with impressive confidence and w/o hesitation at all.  Good speed, consistent cadence and very minimal fatigue.  Pt did exceptionally well with prolonged ambulation on her first attempt.   Stairs            Wheelchair Mobility    Modified Rankin (Stroke Patients Only)       Balance Overall balance assessment: Independent                                           Pertinent Vitals/Pain Pain Assessment:  0-10 Pain Score: 2     Home Living Family/patient expects to be discharged to:: Private residence Living Arrangements: Non-relatives/Friends Available Help at Discharge: Family   Home Access: Stairs to enter       Home Equipment: Environmental consultant - 2 wheels Additional Comments: Pt planning to live with mother initially, then transition to friends house    Prior Function Level of Independence: Independent         Comments: Pt is normally able to be active and do all she needs w/o assist     Hand Dominance        Extremity/Trunk Assessment   Upper Extremity Assessment Upper Extremity Assessment: Overall WFL for tasks assessed (L UE with some shoulder elevation deficit, functional)    Lower Extremity Assessment Lower Extremity Assessment: Overall WFL for tasks assessed (expected R hip post-op weakness, but otherwise functional )       Communication   Communication: No difficulties  Cognition Arousal/Alertness: Awake/alert Behavior During Therapy: WFL for tasks assessed/performed Overall Cognitive Status: Within Functional Limits for tasks assessed                                        General Comments  Exercises     Assessment/Plan    PT Assessment Patient needs continued PT services  PT Problem List Decreased strength;Decreased range of motion;Decreased activity tolerance;Decreased balance;Decreased mobility;Decreased knowledge of use of DME;Decreased safety awareness;Cardiopulmonary status limiting activity;Pain       PT Treatment Interventions DME instruction;Gait training;Stair training;Functional mobility training;Therapeutic activities;Therapeutic exercise;Neuromuscular re-education;Balance training;Patient/family education    PT Goals (Current goals can be found in the Care Plan section)  Acute Rehab PT Goals Patient Stated Goal: wants to go home PT Goal Formulation: With patient Time For Goal Achievement: 12/24/16 Potential to Achieve  Goals: Good    Frequency BID   Barriers to discharge        Co-evaluation               AM-PAC PT "6 Clicks" Daily Activity  Outcome Measure Difficulty turning over in bed (including adjusting bedclothes, sheets and blankets)?: None Difficulty moving from lying on back to sitting on the side of the bed? : None Difficulty sitting down on and standing up from a chair with arms (e.g., wheelchair, bedside commode, etc,.)?: None Help needed moving to and from a bed to chair (including a wheelchair)?: None Help needed walking in hospital room?: None Help needed climbing 3-5 steps with a railing? : A Little 6 Click Score: 23    End of Session Equipment Utilized During Treatment: Gait belt Activity Tolerance: Patient tolerated treatment well Patient left: with chair alarm set;with call bell/phone within reach Nurse Communication: Mobility status PT Visit Diagnosis: Repeated falls (R29.6);Difficulty in walking, not elsewhere classified (R26.2)    Time: 3383-2919 PT Time Calculation (min) (ACUTE ONLY): 28 min   Charges:   PT Evaluation $PT Eval Low Complexity: 1 Low PT Treatments $Gait Training: 8-22 mins   PT G Codes:   PT G-Codes **NOT FOR INPATIENT CLASS** Functional Assessment Tool Used: AM-PAC 6 Clicks Basic Mobility Functional Limitation: Mobility: Walking and moving around Mobility: Walking and Moving Around Current Status (T6606): At least 1 percent but less than 20 percent impaired, limited or restricted Mobility: Walking and Moving Around Goal Status [phone]): 0 percent impaired, limited or restricted    Kreg Shropshire, DPT 12/10/2016, 12:29 PM

## 2016-12-10 NOTE — Progress Notes (Signed)
Physical Therapy Treatment Patient Details Name: Chelsey Watkins MRN: [id number] DOB: 06-22-1945 Today's Date: 12/10/2016    History of Present Illness 71 y/o female here after fall with hip fx. had total hip replacement (anterior approach)    PT Comments    Pt continues to do very well with minimal pain, great confidence and safety with ambulation (also negotiated steps w/o issue) and generally showing good strength and mobility.  She is able to do SLRs and showed excellent quality of motion with all activities requiring hip flexion.  Pt doing well and should be able to return home tomorrow w/o issue (with HHPT) if she continues on her current trajectory.   Follow Up Recommendations  Home health PT     Equipment Recommendations       Recommendations for Other Services       Precautions / Restrictions Precautions Precautions: Anterior Hip;Fall Restrictions Weight Bearing Restrictions: Yes RLE Weight Bearing: Weight bearing as tolerated    Mobility  Bed Mobility Overal bed mobility: Independent             General bed mobility comments: Pt is able to get to EOB and sit confidently w/o issue  Transfers Overall transfer level: Independent Equipment used: Rolling walker (2 wheeled)             General transfer comment: Pt able to rise w/o AD, showed good confidence, safety  Ambulation/Gait Ambulation/Gait assistance: Supervision Ambulation Distance (Feet): 250 Feet Assistive device: Rolling walker (2 wheeled)       General Gait Details: Pt again with no hesitation, good speed, minimal walker reliance and no real fatigue.     Stairs Stairs: Yes   Stair Management: One rail Left Number of Stairs: 4 General stair comments: Pt easily negotiates up/down steps w/o issue, showed good confidence and safety  Wheelchair Mobility    Modified Rankin (Stroke Patients Only)       Balance Overall balance assessment: Independent                                          Cognition Arousal/Alertness: Awake/alert Behavior During Therapy: WFL for tasks assessed/performed Overall Cognitive Status: Within Functional Limits for tasks assessed                                        Exercises Total Joint Exercises Ankle Circles/Pumps: AROM;10 reps Quad Sets: Strengthening;10 reps Gluteal Sets: Strengthening;10 reps Heel Slides: Strengthening;10 reps Hip ABduction/ADduction: Strengthening;10 reps Straight Leg Raises: AROM;10 reps    General Comments        Pertinent Vitals/Pain Pain Assessment: 0-10 Pain Score: 2     Home Living Family/patient expects to be discharged to:: Private residence Living Arrangements: Non-relatives/Friends Available Help at Discharge: Family   Home Access: Stairs to enter     Home Equipment: Environmental consultant - 2 wheels Additional Comments: Pt planning to live with mother initially, then transition to friends house    Prior Function Level of Independence: Independent      Comments: Pt is normally able to be active and do all she needs w/o assist   PT Goals (current goals can now be found in the care plan section) Acute Rehab PT Goals Patient Stated Goal: wants to go home PT Goal Formulation: With patient Time For Goal Achievement:  12/24/16 Potential to Achieve Goals: Good Progress towards PT goals: Progressing toward goals    Frequency    BID      PT Plan Current plan remains appropriate    Co-evaluation              AM-PAC PT "6 Clicks" Daily Activity  Outcome Measure  Difficulty turning over in bed (including adjusting bedclothes, sheets and blankets)?: None Difficulty moving from lying on back to sitting on the side of the bed? : None Difficulty sitting down on and standing up from a chair with arms (e.g., wheelchair, bedside commode, etc,.)?: None Help needed moving to and from a bed to chair (including a wheelchair)?: None Help needed walking in  hospital room?: None Help needed climbing 3-5 steps with a railing? : None 6 Click Score: 24    End of Session Equipment Utilized During Treatment: Gait belt Activity Tolerance: Patient tolerated treatment well Patient left: with chair alarm set;with call bell/phone within reach Nurse Communication: Mobility status PT Visit Diagnosis: Muscle weakness (generalized) (M62.81);Difficulty in walking, not elsewhere classified (R26.2)     Time: 1610-9604 PT Time Calculation (min) (ACUTE ONLY): 26 min  Charges:  $Gait Training: 8-22 mins $Therapeutic Exercise: 8-22 mins                    G Codes:  Functional Assessment Tool Used: AM-PAC 6 Clicks Basic Mobility Functional Limitation: Mobility: Walking and moving around Mobility: Walking and Moving Around Current Status (V4098): At least 1 percent but less than 20 percent impaired, limited or restricted Mobility: Walking and Moving Around Goal Status [phone]): 0 percent impaired, limited or restricted    Kreg Shropshire, DPT 12/10/2016, 3:51 PM

## 2016-12-10 NOTE — Progress Notes (Signed)
[place] [facility] [place] NAME: Chelsey Watkins    MR#:  [id number]  DATE OF BIRTH:  November 08, 1965  SUBJECTIVE: seen at bedside,having no pain issues, walker and her nurses station with physical therapy.  CHIEF COMPLAINT:   Chief Complaint  Patient presents with  . Fall  . Leg Pain    REVIEW OF SYSTEMS:   ROS CONSTITUTIONAL: No fever, fatigue or weakness.  EYES: No blurred or double vision.  EARS, NOSE, AND THROAT: No tinnitus or ear pain.  RESPIRATORY: No cough, shortness of breath, wheezing or hemoptysis.  CARDIOVASCULAR: No chest pain, orthopnea, edema.  GASTROINTESTINAL: No nausea, vomiting, diarrhea or abdominal pain.  GENITOURINARY: No dysuria, hematuria.  ENDOCRINE: No polyuria, nocturia,  HEMATOLOGY: No anemia, easy bruising or bleeding SKIN: No rash or lesion. MUSCULOSKELETAL:  Minimal  pain to right hip.  NEUROLOGIC: No tingling, numbness, weakness.  PSYCHIATRY: No anxiety or depression.   DRUG ALLERGIES:  No Known Allergies  VITALS:  Blood pressure 138/71, pulse 87, temperature 98 F (36.7 C), temperature source Oral, resp. rate 18, height 5\' 2"  (1.575 m), weight 40.8 kg (90 lb), SpO2 100 %.  PHYSICAL EXAMINATION:  GENERAL:  71 y.o.-year-old patient lying in the bed with no acute distress.  EYES: Pupils equal, round, reactive to light and accommodation. No scleral icterus. Extraocular muscles intact.  HEENT: Head atraumatic, normocephalic. Oropharynx and nasopharynx clear.  NECK:  Supple, no jugular venous distention. No thyroid enlargement, no tenderness.  LUNGS: Normal breath sounds bilaterally, no wheezing, rales,rhonchi or crepitation. No use of accessory muscles of respiration.  CARDIOVASCULAR: S1, S2 normal. No murmurs, rubs, or gallops.  ABDOMEN: Soft, nontender, nondistended. Bowel sounds present. No organomegaly or mass.  EXTREMITIES: No pedal edema, cyanosis, or clubbing.  NEUROLOGIC: Cranial nerves II  through XII are intact. Muscle strength 5/5 in all extremities. Sensation intact. Gait not checked.  PSYCHIATRIC: The patient is alert and oriented x 3.  SKIN: No obvious rash, lesion, or ulcer.    LABORATORY PANEL:   CBC  Recent Labs Lab 12/09/16 0632  WBC 11.3*  HGB 7.9*  HCT 24.6*  PLT 189   ------------------------------------------------------------------------------------------------------------------  Chemistries   Recent Labs Lab 12/10/16 0349  NA 135  K 4.4  CL 105  CO2 25  GLUCOSE 239*  BUN 10  CREATININE 0.54  CALCIUM 7.8*   ------------------------------------------------------------------------------------------------------------------  Cardiac Enzymes No results for input(s): TROPONINI in the last 168 hours. ------------------------------------------------------------------------------------------------------------------  RADIOLOGY:  Dg Chest Portable 1 View  Result Date: 12/09/2016 CLINICAL DATA:  Status post fall, with concern for chest injury. Initial encounter. EXAM: PORTABLE CHEST 1 VIEW COMPARISON:  None. FINDINGS: The lungs are well-aerated. Minimal scarring is noted at the right midlung zone. There is no evidence of focal opacification, pleural effusion or pneumothorax. The cardiomediastinal silhouette is within normal limits. No acute osseous abnormalities are seen. A right-sided chest port is noted ending about the proximal SVC. IMPRESSION: No acute cardiopulmonary process seen. Electronically Signed   By: Garald Balding M.D.   On: 12/09/2016 01:49   Dg Hip Operative Unilat W Or W/o Pelvis Right  Result Date: 12/09/2016 CLINICAL DATA:  Displaced femoral neck fracture. EXAM: OPERATIVE right HIP (WITH PELVIS IF PERFORMED) 1 VIEWS TECHNIQUE: Fluoroscopic spot image(s) were submitted for interpretation post-operatively. COMPARISON:  Radiographs 12/09/2016 FINDINGS: Bipolar hip prosthesis in good position without complicating features. The tip of the  femoral stem is not included. IMPRESSION: Bipolar hip prosthesis. Electronically Signed   By: Mamie Nick.  Gallerani M.D.   On: 12/09/2016 13:21   Dg Hip Unilat W Or W/o Pelvis 2-3 Views Right  Result Date: 12/09/2016 CLINICAL DATA:  Right hip arthroplasty. EXAM: DG HIP (WITH OR WITHOUT PELVIS) 2-3V RIGHT COMPARISON:  Pelvic x-rays from same day. FINDINGS: Postsurgical changes related to interval right hip hemiarthroplasty. Alignment is anatomic. No evidence of hardware failure. Expected postsurgical changes including skin staples, wound VAC, and subcutaneous emphysema about the right hip. IMPRESSION: Interval right hip hemiarthroplasty without evidence of acute postoperative complication. Electronically Signed   By: Titus Dubin M.D.   On: 12/09/2016 14:27   Dg Hip Unilat W Or Wo Pelvis 2-3 Views Right  Result Date: 12/09/2016 CLINICAL DATA:  Status post fall, with right hip pain. Initial encounter. EXAM: DG HIP (WITH OR WITHOUT PELVIS) 2-3V RIGHT COMPARISON:  None. FINDINGS: There is a displaced subcapital fracture of the right femoral neck, with medial displacement of the femoral head. Both femoral heads are seated normally within their respective acetabula. No significant degenerative change is appreciated. The sacroiliac joints are unremarkable in appearance. The visualized bowel gas pattern is grossly unremarkable in appearance. IMPRESSION: Displaced subcapital fracture of the right femoral neck. Electronically Signed   By: Garald Balding M.D.   On: 12/09/2016 01:48    EKG:  No orders found for this or any previous visit.  ASSESSMENT AND PLAN:   1.acute  right hip fracture status post repair with right hip hemiarthroplasty, postoperatively patient has minimal pain, ambulated well with physical therapy. Likely discharge home tomorrow  with home physical therapy, DVT prophylaxis, pain medicines.  #2 .severe malnutrition in the context of chronic illness.continue nutritional supplements. #3. history  of pancreatic cancer: Getting chemotherapy at [facility].    All the records are reviewed and case discussed with Care Management/Social Workerr. Management plans discussed with the patient, family and they are in agreement.  CODE STATUS: full  TOTAL TIME TAKING CARE OF THIS PATIENT: 35 minutes.   POSSIBLE D/C IN  1DAY, DEPENDING ON CLINICAL CONDITION.   Epifanio Lesches M.D on 12/10/2016 at 9:49 AM  Between 7am to 6pm - Pager - [phone]  After 6pm go to [email or web address] - password [email or web address] [facility]  Office  [phone]  CC: Primary care physician; Gayland Curry, MD   Note: This dictation was prepared with Dragon dictation along with smaller phrase technology. Any transcriptional errors that result from this process are unintentional.

## 2016-12-10 NOTE — Progress Notes (Signed)
   Subjective: 1 Day Post-Op Procedure(s) (LRB): ANTERIOR APPROACH HEMI HIP ARTHROPLASTY (Right) Patient reports pain as 0 on 0-10 scale.   Patient is well, and has had no acute complaints or problems We will start therapy today.  Plan is to go Home after hospital stay. no nausea and no vomiting Patient denies any chest pains or shortness of breath. States leg feels great. Moving leg all over the place Objective: Vital signs in last 24 hours: Temp:  [97.8 F (36.6 C)-99 F (37.2 C)] 98 F (36.7 C) (10/13 0812) Pulse Rate:  [66-90] 87 (10/13 0812) Resp:  [9-19] 18 (10/13 0812) BP: (105-156)/(51-82) 138/71 (10/13 0812) SpO2:  [89 %-100 %] 100 % (10/13 0812) Weight:  [40.8 kg (90 lb)] 40.8 kg (90 lb) (10/12 1050) Heels are non tender and elevated off the bed using rolled towels Intake/Output from previous day: 10/12 0701 - 10/13 0700 In: 3636.3 [I.V.:3636.3] Out: 1555 [Urine:1205; Blood:350] Intake/Output this shift: No intake/output data recorded.   Recent Labs  12/09/16 0632  HGB 7.9*    Recent Labs  12/09/16 0632  WBC 11.3*  RBC 2.39*  HCT 24.6*  PLT 189    Recent Labs  12/09/16 0632 12/10/16 0349  NA 137 135  K 3.9 4.4  CL 108 105  CO2 26 25  BUN 8 10  CREATININE 0.46 0.54  GLUCOSE 88 239*  CALCIUM 7.9* 7.8*    Recent Labs  12/09/16 0973  INR 1.07    EXAM General - Patient is Alert, Appropriate and Oriented Extremity - Neurologically intact Neurovascular intact Sensation intact distally Intact pulses distally Dorsiflexion/Plantar flexion intact No cellulitis present Compartment soft Dressing - wound vac Motor Function - intact, moving foot and toes well on exam.    Past Medical History:  Diagnosis Date  . Cancer ([place])    skin ca  . Kidney stones   . Pancreatic cancer ([facility])     Assessment/Plan: 1 Day Post-Op Procedure(s) (LRB): ANTERIOR APPROACH HEMI HIP ARTHROPLASTY (Right) Active Problems:   Hip fracture ([facility])  Estimated  body mass index is 16.46 kg/m as calculated from the following:   Height as of this encounter: 5\' 2"  (1.575 m).   Weight as of this encounter: 40.8 kg (90 lb). Advance diet Up with therapy D/C IV fluids Plan for discharge tomorrow Discharge home with home health  Labs: reviewed DVT Prophylaxis - Lovenox, Foot Pumps and TED hose Weight-Bearing as tolerated to right leg D/C O2 and Pulse OX and try on Room Air Begin working on a bowel movement Plan to d/c poss. Tomorrow if medically stable.  Jillyn Ledger. Garrison Rifton 12/10/2016, 8:28 AM

## 2016-12-11 MED ORDER — ENOXAPARIN SODIUM 40 MG/0.4ML ~~LOC~~ SOLN: 40.0000 mg | SUBCUTANEOUS | 0 refills | Status: DC

## 2016-12-11 MED ORDER — ENOXAPARIN SODIUM 30 MG/0.3ML ~~LOC~~ SOLN: 30.0000 mg | Freq: Two times a day (BID) | SUBCUTANEOUS | 0 refills | Status: DC

## 2016-12-11 NOTE — Progress Notes (Signed)
   Subjective: 2 Days Post-Op Procedure(s) (LRB): ANTERIOR APPROACH HEMI HIP ARTHROPLASTY (Right) Patient reports pain as 0 on 0-10 scale.   Patient is well, and has had no acute complaints or problems Did great with therapy yesterday Plan is to go Home after hospital stay. no nausea and no vomiting Patient denies any chest pains or shortness of breath. Objective: Vital signs in last 24 hours: Temp:  [98 F (36.7 C)-98.9 F (37.2 C)] 98.6 F (37 C) (10/14 0014) Pulse Rate:  [83-96] 88 (10/14 0014) Resp:  [16-18] 18 (10/14 0014) BP: (98-139)/(55-71) 98/55 (10/14 0014) SpO2:  [95 %-100 %] 98 % (10/14 0014) Weight:  [52.5 kg (115 lb 12.8 oz)] 52.5 kg (115 lb 12.8 oz) (10/14 0500) Heels are non tender and elevated off the bed using rolled towels Intake/Output from previous day: 10/13 0701 - 10/14 0700 In: 480 [P.O.:480] Out: -  Intake/Output this shift: No intake/output data recorded.   Recent Labs  12/09/16 0632  HGB 7.9*    Recent Labs  12/09/16 0632  WBC 11.3*  RBC 2.39*  HCT 24.6*  PLT 189    Recent Labs  12/09/16 0632 12/10/16 0349  NA 137 135  K 3.9 4.4  CL 108 105  CO2 26 25  BUN 8 10  CREATININE 0.46 0.54  GLUCOSE 88 239*  CALCIUM 7.9* 7.8*    Recent Labs  12/09/16 6384  INR 1.07    EXAM General - Patient is Alert, Disorganized and Oriented Extremity - Neurologically intact Neurovascular intact Sensation intact distally Intact pulses distally Dorsiflexion/Plantar flexion intact No cellulitis present Compartment soft Dressing - wound vac Motor Function - intact, moving foot and toes well on exam.    Past Medical History:  Diagnosis Date  . Cancer ([place])    skin ca  . Kidney stones   . Pancreatic cancer ([facility])     Assessment/Plan: 2 Days Post-Op Procedure(s) (LRB): ANTERIOR APPROACH HEMI HIP ARTHROPLASTY (Right) Active Problems:   Hip fracture ([facility])  Estimated body mass index is 21.18 kg/m as calculated from the following:  Height as of this encounter: 5\' 2"  (1.575 m).   Weight as of this encounter: 52.5 kg (115 lb 12.8 oz). Up with therapy Discharge home with home health  Labs: none DVT Prophylaxis - Lovenox, Foot Pumps and TED hose Weight-Bearing as tolerated to left leg Wound vac d/c'd Follow up in office in 2 weeks Please apply honeycomb dressing and give the pt 2 extra honeycomb dressing to take home lovenox 40mg   Qd for 14 days  Angelica Wix R. [place] 12/11/2016, 7:23 AM

## 2016-12-11 NOTE — Progress Notes (Signed)
Physical Therapy Treatment Patient Details Name: Chelsey Watkins MRN: [id number] DOB: 05-02-1945 Today's Date: 12/11/2016    History of Present Illness 71 y/o female here after fall with hip fx. had total hip replacement (anterior approach)    PT Comments    Pt ambulating in room unassisted upon arrival.  She was able to continue ambulating around unit with ease and no LOB.  Participated in exercises as described below.  Pt with no further questions or concerns regarding mobility at home.     Follow Up Recommendations  Home health PT     Equipment Recommendations  Rolling walker with 5" wheels    Recommendations for Other Services       Precautions / Restrictions Precautions Precautions: Anterior Hip;Fall Restrictions Weight Bearing Restrictions: Yes RLE Weight Bearing: Weight bearing as tolerated    Mobility  Bed Mobility Overal bed mobility: Independent                Transfers Overall transfer level: Independent               General transfer comment: Pt able to rise w/o AD, showed good confidence, safety  Ambulation/Gait Ambulation/Gait assistance: Supervision Ambulation Distance (Feet): 250 Feet Assistive device: Rolling walker (2 wheeled) Gait Pattern/deviations: Step-through pattern   Gait velocity interpretation: Below normal speed for age/gender General Gait Details: Pt again with no hesitation, good speed, minimal walker reliance and no real fatigue.     Stairs         General stair comments: declined review.  Stated she is confident from yesterdays training.  Wheelchair Mobility    Modified Rankin (Stroke Patients Only)       Balance Overall balance assessment: Independent                                          Cognition Arousal/Alertness: Awake/alert Behavior During Therapy: WFL for tasks assessed/performed Overall Cognitive Status: Within Functional Limits for tasks assessed                                         Exercises Other Exercises Other Exercises: Standing exercises for R hip -heel raises, SLR, ab/add x 10.    General Comments        Pertinent Vitals/Pain Pain Assessment: 0-10 Pain Score: 2  Pain Descriptors / Indicators: Sore    Home Living                      Prior Function            PT Goals (current goals can now be found in the care plan section) Progress towards PT goals: Progressing toward goals    Frequency    BID      PT Plan Current plan remains appropriate    Co-evaluation              AM-PAC PT "6 Clicks" Daily Activity  Outcome Measure  Difficulty turning over in bed (including adjusting bedclothes, sheets and blankets)?: None Difficulty moving from lying on back to sitting on the side of the bed? : None Difficulty sitting down on and standing up from a chair with arms (e.g., wheelchair, bedside commode, etc,.)?: None Help needed moving to and from a bed to chair (including a wheelchair)?:  None Help needed walking in hospital room?: None Help needed climbing 3-5 steps with a railing? : None 6 Click Score: 24    End of Session Equipment Utilized During Treatment: Gait belt Activity Tolerance: Patient tolerated treatment well Patient left: in bed;with nursing/sitter in room;with call bell/phone within reach         Time: 0174-9449 PT Time Calculation (min) (ACUTE ONLY): 9 min  Charges:  $Gait Training: 8-22 mins                    G Codes:      Chesley Noon, PTA 12/11/16, 10:13 AM

## 2016-12-11 NOTE — Discharge Instructions (Signed)
Patient Name Sex DOB SSN  Chelsey Watkins  @GENDER @ 12/28/1945 [id number]    Discharge Planning by Vance Peper R.   Author: Watt Climes Service: Orthopedics Author Type: Physician Assistant    Filed: @T @ Note Time: 7:31 AM Status: Signed   Editor: Watt Climes., PA     Expand All Collapse All   ANTERIOR APPROACH TOTAL HIP REPLACEMENT POSTOPERATIVE DIRECTIONS   Hip Rehabilitation, Guidelines Following Surgery  The results of a hip operation are greatly improved after range of motion and muscle strengthening exercises. Follow all safety measures which are given to protect your hip. If any of these exercises cause increased pain or swelling in your joint, decrease the amount until you are comfortable again. Then slowly increase the exercises. Call your caregiver if you have problems or questions.   HOME CARE INSTRUCTIONS   Remove items at home which could result in a fall. This includes throw rugs or furniture in walking pathways.   ICE to the affected hip every three hours for 30 minutes at a time and then as needed for pain and swelling. Continue to use ice on the hip for pain and swelling from surgery. You may notice swelling that will progress down to the foot and ankle. This is normal after surgery. Elevate the leg when you are not up walking on it.   Continue to use the breathing machine which will help keep your temperature down. It is common for your temperature to cycle up and down following surgery, especially at night when you are not up moving around and exerting yourself. The breathing machine keeps your lungs expanded and your temperature down.  Do not place pillow under knee, focus on keeping the knee straight while resting  DIET You may resume your previous home diet once your are discharged from the hospital.  DRESSING / WOUND CARE / SHOWERING Change the surgical dressing as needed If the wound gets wet inside, change the dressing with sterile gauze.  Please use good hand washing techniques before changing the dressing. Do not use any lotions or creams on the incision until instructed by your surgeon.Keep your dressing dry with showering.  Keep your dressing dry with showering. You can keep it covered and pat dry.  STAPLE REMOVAL: Please remove staples two weeks post op and apply benzoin and 1/2 inch steri strips  ACTIVITY Walk with your walker as instructed. Use walker as long as suggested by your caregivers. Avoid periods of inactivity such as sitting longer than an hour when not asleep. This helps prevent blood clots.  You may resume a sexual relationship in one month or when given the OK by your doctor.  You may return to work once you are cleared by your doctor.  Do not drive a car for 6 weeks or until released by you surgeon.  Do not drive while taking narcotics.  WEIGHT BEARING Weight bearing as tolerated with assist device (walker, cane, etc) as directed, use it as long as suggested by your surgeon or therapist, typically at least 4-6 weeks.  POSTOPERATIVE CONSTIPATION PROTOCOL Constipation - defined medically as fewer than three stools per week and severe constipation as less than one stool per week.  One of the most common issues patients have following surgery is constipation. Even if you have a regular bowel pattern at home, your normal regimen is likely to be disrupted due to multiple reasons following surgery. Combination of anesthesia, postoperative narcotics, change in appetite and fluid intake all can affect your bowels.  In order to avoid complications following surgery, here are some recommendations in order to help you during your recovery period.  Colace (docusate) - Pick up an over-the-counter form of Colace or another stool softener and take twice a day as long as you are requiring postoperative pain medications. Take with a full glass of water daily. If you experience loose stools or diarrhea, hold the  colace until you stool forms back up. If your symptoms do not get better within 1 week or if they get worse, check with your doctor.  Dulcolax (bisacodyl) - Pick up over-the-counter and take as directed by the product packaging as needed to assist with the movement of your bowels. Take with a full glass of water. Use this product as needed if not relieved by Colace only.   MiraLax (polyethylene glycol) - Pick up over-the-counter to have on hand. MiraLax is a solution that will increase the amount of water in your bowels to assist with bowel movements. Take as directed and can mix with a glass of water, juice, soda, coffee, or tea. Take if you go more than two days without a movement. Do not use MiraLax more than once per day. Call your doctor if you are still constipated or irregular after using this medication for 7 days in a row.  If you continue to have problems with postoperative constipation, please contact the office for further assistance and recommendations. If you experience "the worst abdominal pain ever" or develop nausea or vomiting, please contact the office immediatly for further recommendations for treatment.  ITCHING If you experience itching with your medications, try taking only a single pain pill, or even half a pain pill at a time. You can also use Benadryl over the counter for itching or also to help with sleep.   TED HOSE STOCKINGS Wear the elastic stockings on both legs for 6 weeks following surgery during the day but you may remove then at night for sleeping.  MEDICATIONS See your medication summary on the After Visit Summary that the nursing staff will review with you prior to discharge. You may have some home medications which will be placed on hold until you complete the course of blood thinner medication. It is important for you to complete the blood thinner medication as prescribed by your surgeon. Continue your approved medications as instructed at time of  discharge.  PRECAUTIONS If you experience chest pain or shortness of breath - call 911 immediately for transfer to the hospital emergency department.  If you develop a fever greater that 101 F, purulent drainage from wound, increased redness or drainage from wound, foul odor from the wound/dressing, or calf pain - CONTACT YOUR SURGEON.    FOLLOW-UP APPOINTMENTS Make sure you keep all of your appointments after your operation with your surgeon and caregivers. You should call the office at the above phone number and make an appointment for approximately two weeks after the date of your surgery or on the date instructed by your surgeon outlined in the "After Visit Summary".  RANGE OF MOTION AND STRENGTHENING EXERCISES  These exercises are designed to help you keep full movement of your hip joint. Follow your caregiver's or physical therapist's instructions. Perform all exercises about fifteen times, three times per day or as directed. Exercise both hips, even if you have had only one joint replacement. These exercises can be done on a training (exercise) mat, on the floor, on a table or on a bed. Use whatever works the best and is  most comfortable for you. Use music or television while you are exercising so that the exercises are a pleasant break in your day. This will make your life better with the exercises acting as a break in routine you can look forward to.   Lying on your back, slowly slide your foot toward your buttocks, raising your knee up off the floor. Then slowly slide your foot back down until your leg is straight again.   Lying on your back spread your legs as far apart as you can without causing discomfort.   Lying on your side, raise your upper leg and foot straight up from the floor as far as is comfortable. Slowly lower the leg and repeat.   Lying on your back, tighten up the muscle in the front of your thigh (quadriceps muscles).  You can do this by keeping your leg straight and trying to raise your heel off the floor. This helps strengthen the largest muscle supporting your knee.   Lying on your back, tighten up the muscles of your buttocks both with the legs straight and with the knee bent at a comfortable angle while keeping your heel on the floor.  IF YOU ARE TRANSFERRED TO A SKILLED REHAB FACILITY If the patient is transferred to a skilled rehab facility following release from the hospital, a list of the current medications will be sent to the facility for the patient to continue. When discharged from the skilled rehab facility, please have the facility set up the patient's [place] prior to being released. Also, the skilled facility will be responsible for providing the patient with their medications at time of release from the facility to include their pain medication, the muscle relaxants, and their blood thinner medication. If the patient is still at the rehab facility at time of the two week follow up appointment, the skilled rehab facility will also need to assist the patient in arranging follow up appointment in our office and any transportation needs.  MAKE SURE YOU:   Understand these instructions.   Get help right away if you are not doing well or get worse.   Pick up stool softner and laxative for home use following surgery while on pain medications. Do not submerge incision under water. Please use good hand washing techniques while changing dressing each day. May shower starting three days after surgery. Please use a clean towel to pat the incision dry following showers. Continue to use ice for pain and swelling after surgery. Do not use any lotions or creams on the incision until instructed by your surgeon.

## 2016-12-11 NOTE — Progress Notes (Signed)
[place] [facility] [place] NAME: Chelsey Watkins    MR#:  [id number]  DATE OF BIRTH:  08/25/1945  SUBJECTIVE: stable for discharge today.  CHIEF COMPLAINT:   Chief Complaint  Patient presents with  . Fall  . Leg Pain    REVIEW OF SYSTEMS:   ROS CONSTITUTIONAL: No fever, fatigue or weakness.  EYES: No blurred or double vision.  EARS, NOSE, AND THROAT: No tinnitus or ear pain.  RESPIRATORY: No cough, shortness of breath, wheezing or hemoptysis.  CARDIOVASCULAR: No chest pain, orthopnea, edema.  GASTROINTESTINAL: No nausea, vomiting, diarrhea or abdominal pain.  GENITOURINARY: No dysuria, hematuria.  ENDOCRINE: No polyuria, nocturia,  HEMATOLOGY: No anemia, easy bruising or bleeding SKIN: No rash or lesion. MUSCULOSKELETAL:  Minimal  pain to right hip.  NEUROLOGIC: No tingling, numbness, weakness.  PSYCHIATRY: No anxiety or depression.   DRUG ALLERGIES:  No Known Allergies  VITALS:  Blood pressure (!) 129/59, pulse 85, temperature 98.4 F (36.9 C), temperature source Oral, resp. rate 18, height 5\' 2"  (1.575 m), weight 52.5 kg (115 lb 12.8 oz), SpO2 97 %.  PHYSICAL EXAMINATION:  GENERAL:  71 y.o.-year-old patient lying in the bed with no acute distress.  EYES: Pupils equal, round, reactive to light and accommodation. No scleral icterus. Extraocular muscles intact.  HEENT: Head atraumatic, normocephalic. Oropharynx and nasopharynx clear.  NECK:  Supple, no jugular venous distention. No thyroid enlargement, no tenderness.  LUNGS: Normal breath sounds bilaterally, no wheezing, rales,rhonchi or crepitation. No use of accessory muscles of respiration.  CARDIOVASCULAR: S1, S2 normal. No murmurs, rubs, or gallops.  ABDOMEN: Soft, nontender, nondistended. Bowel sounds present. No organomegaly or mass.  EXTREMITIES: No pedal edema, cyanosis, or clubbing.  NEUROLOGIC: Cranial nerves II through XII are intact. Muscle strength 5/5 in all  extremities. Sensation intact. Gait not checked.  PSYCHIATRIC: The patient is alert and oriented x 3.  SKIN: No obvious rash, lesion, or ulcer.    LABORATORY PANEL:   CBC  Recent Labs Lab 12/09/16 0632  WBC 11.3*  HGB 7.9*  HCT 24.6*  PLT 189   ------------------------------------------------------------------------------------------------------------------  Chemistries   Recent Labs Lab 12/10/16 0349  NA 135  K 4.4  CL 105  CO2 25  GLUCOSE 239*  BUN 10  CREATININE 0.54  CALCIUM 7.8*   ------------------------------------------------------------------------------------------------------------------  Cardiac Enzymes No results for input(s): TROPONINI in the last 168 hours. ------------------------------------------------------------------------------------------------------------------  RADIOLOGY:  Dg Hip Operative Unilat W Or W/o Pelvis Right  Result Date: 12/09/2016 CLINICAL DATA:  Displaced femoral neck fracture. EXAM: OPERATIVE right HIP (WITH PELVIS IF PERFORMED) 1 VIEWS TECHNIQUE: Fluoroscopic spot image(s) were submitted for interpretation post-operatively. COMPARISON:  Radiographs 12/09/2016 FINDINGS: Bipolar hip prosthesis in good position without complicating features. The tip of the femoral stem is not included. IMPRESSION: Bipolar hip prosthesis. Electronically Signed   By: Marijo Sanes M.D.   On: 12/09/2016 13:21   Dg Hip Unilat W Or W/o Pelvis 2-3 Views Right  Result Date: 12/09/2016 CLINICAL DATA:  Right hip arthroplasty. EXAM: DG HIP (WITH OR WITHOUT PELVIS) 2-3V RIGHT COMPARISON:  Pelvic x-rays from same day. FINDINGS: Postsurgical changes related to interval right hip hemiarthroplasty. Alignment is anatomic. No evidence of hardware failure. Expected postsurgical changes including skin staples, wound VAC, and subcutaneous emphysema about the right hip. IMPRESSION: Interval right hip hemiarthroplasty without evidence of acute postoperative complication.  Electronically Signed   By: Titus Dubin M.D.   On: 12/09/2016 14:27    EKG:  No orders found for this or any previous visit.  ASSESSMENT AND PLAN:   1.acute  right hip fracture status post repair with right hip hemiarthroplasty, postoperatively patient has minimal pain, ambulated well with physical therapy. Discharge  home with home physical therapy, Lovenox for 14 days# 2 .severe malnutrition in the context of chronic illness.continue nutritional supplements. #3. history of pancreatic cancer: Getting chemotherapy at [facility].    All the records are reviewed and case discussed with Care Management/Social Workerr. Management plans discussed with the patient, family and they are in agreement.  CODE STATUS: full  TOTAL TIME TAKING CARE OF THIS PATIENT: 35 minutes.   disCharge home today.Marland Kitchen   Epifanio Lesches M.D on 12/11/2016 at 9:54 AM  Between 7am to 6pm - Pager - [phone]  After 6pm go to [email or web address] - password [email or web address] [facility]  Office  [phone]  CC: Primary care physician; Gayland Curry, MD   Note: This dictation was prepared with Dragon dictation along with smaller phrase technology. Any transcriptional errors that result from this process are unintentional.

## 2016-12-11 NOTE — Progress Notes (Signed)
Patient is being discharged home with [facility]. DC & Rx instructions given and patient acknowledged understanding. IV removed/belongings packed.  Patient called friend to come and transport home.

## 2016-12-11 NOTE — Care Management Note (Signed)
Case Management Note  Patient Details  Name: Chelsey Watkins MRN: [id number] Date of Birth: 11-05-1945  Subjective/Objective:       Discharge planning discussed with Chelsey Watkins. She chose [facility] from list and a referral for HH-PT was called to Melene Muller at [facility]. Chelsey Ask reports that she already has a RW and BSC at home.              Action/Plan:   Expected Discharge Date:  12/11/16               Expected Discharge Plan:  [place]  In-House Referral:     Discharge planning Services  CM Consult  Post Acute Care Choice:  Home Health Choice offered to:  Patient  DME Arranged:  N/A (Already has a RW and BSC per patient report.) DME Agency:  NA  HH Arranged:  PT [place] Agency:  [facility]  Status of Service:  Completed, signed off  If discussed at [place] of Stay Meetings, dates discussed:    Additional Comments:  Karey Suthers A, RN 12/11/2016, 10:51 AM

## 2016-12-12 NOTE — Discharge Summary (Signed)
Chelsey Watkins, is a 71 y.o. female  DOB Sep 06, 1945  MRN [id number].  Admission date:  12/09/2016  Admitting Physician  Saundra Shelling, MD  Discharge Date:  12/11/2016   Primary MD  Gayland Curry, MD  Recommendations for primary care physician for things to follow:   Follow-up with PCP in one week,  followe at [facility] orthopedic in 2 weeks.  Admission Diagnosis  Subcapital fracture of hip, right, closed, initial encounter [facility]) [S72.011A]   Discharge Diagnosis  Subcapital fracture of hip, right, closed, initial encounter ([place]) [S72.011A]    Active Problems:   Hip fracture [facility])      Past Medical History:  Diagnosis Date  . Cancer ([place])    skin ca  . Kidney stones   . Pancreatic cancer [facility])     Past Surgical History:  Procedure Laterality Date  . ANTERIOR APPROACH HEMI HIP ARTHROPLASTY Right 12/09/2016   Procedure: ANTERIOR APPROACH HEMI HIP ARTHROPLASTY;  Surgeon: Hessie Knows, MD;  Location: [facility] ORS;  Service: Orthopedics;  Laterality: Right;  . none         History of present illness and  Hospital Course:     Kindly see H&P for history of present illness and admission details, please review complete Labs, Consult reports and Test reports for all details in brief  HPI  from the history and physical done on the day of admission  71 year old female patient with history of pancreatic cancer had a fall at [facility].Right hip fracture.  Hospital Course  #1 acute right hip fracture: Status post repair by orthopedic with anterior hemiarthroplasty of the right hip. Postoperatively patient had minimal pain and numbness and (2). Discharge home with home physical therapy, Lovenox for 2 weeks, follow orthopedic in 2 weeks. Patient is seen by Dr. Rudene Christians.  #2. pancreatic cancer: Getting chemotherapy at  [facility]. #3 .severe malnutrition in the contest of chronic illness: Continue nutritional supplements.   Discharge Condition:    Follow UP  Follow-up Information    Hessie Knows, MD Follow up in 2 week(s).   Specialty:  Orthopedic Surgery Contact information: [place] [address] [phone]        Gayland Curry, MD Follow up in 2 week(s).   Specialty:  Family Medicine Contact information: [place] [address] [phone]             Discharge Instructions  and  Discharge Medications    Discharge Instructions    Face-to-face encounter (required for Medicare/Medicaid patients)    Complete by:  As directed    I [name] certify that this patient is under my care and that I, or a nurse practitioner or physician's assistant working with me, had a face-to-face encounter that meets the physician face-to-face encounter requirements with this patient on 12/11/2016. The encounter with the patient was in whole, or in part for the following medical condition(s) which is the primary right hip fracture s/p repair;needs PT Pancreatic Cancer   The encounter with the patient was in whole, or in part, for the following medical condition, which is the primary reason for home health care:  whole   I certify that, based on my findings, the following services are medically necessary home health services:  Physical therapy   Reason for Medically Necessary Home Health Services:   Skilled Nursing- Administration and Training of Injectable Medication Therapy- Personnel officer, Training and development officer and Stair Training     My clinical findings support the  need for the above services:  Unable to leave home safely without assistance and/or assistive device   Further, I certify that my clinical findings support that this patient is homebound due to:  Unsafe ambulation due to balance issues   Home Health    Complete by:  As  directed    To provide the following care/treatments:  PT     Allergies as of 12/11/2016   No Known Allergies     Medication List    STOP taking these medications   acetaminophen 500 MG tablet Commonly known as:  TYLENOL   baclofen 10 MG tablet Commonly known as:  LIORESAL   MAGNESIUM-OXIDE 400 (241.3 Mg) MG tablet Generic drug:  magnesium oxide     TAKE these medications   dexamethasone 4 MG tablet Commonly known as:  DECADRON Take 2 tablets by mouth daily.   enoxaparin 40 MG/0.4ML injection Commonly known as:  LOVENOX Inject 0.4 mLs (40 mg total) into the skin daily.   lidocaine-prilocaine cream Commonly known as:  EMLA Apply topically.   ondansetron 8 MG tablet Commonly known as:  ZOFRAN Take by mouth.   oxyCODONE 5 MG immediate release tablet Commonly known as:  Oxy IR/ROXICODONE Take 3 tablets by mouth every 4 (four) hours while awake.   polyethylene glycol packet Commonly known as:  MIRALAX / GLYCOLAX Take by mouth.   potassium chloride SA 20 MEQ tablet Commonly known as:  K-DUR,KLOR-CON Take 1 tablet by mouth daily.   prochlorperazine 10 MG tablet Commonly known as:  COMPAZINE Take by mouth.   sulfamethoxazole-trimethoprim 800-160 MG tablet Commonly known as:  BACTRIM DS,SEPTRA DS Take 1 tablet by mouth 2 (two) times daily.   zolpidem 10 MG tablet Commonly known as:  AMBIEN Take 1 tablet by mouth at bedtime as needed.         Diet and Activity recommendation: See Discharge Instructions above   Consults obtained -ortho   Major procedures and Radiology Reports - PLEASE review detailed and final reports for all details, in brief -      Dg Chest Portable 1 View  Result Date: 12/09/2016 CLINICAL DATA:  Status post fall, with concern for chest injury. Initial encounter. EXAM: PORTABLE CHEST 1 VIEW COMPARISON:  None. FINDINGS: The lungs are well-aerated. Minimal scarring is noted at the right midlung zone. There is no evidence of focal  opacification, pleural effusion or pneumothorax. The cardiomediastinal silhouette is within normal limits. No acute osseous abnormalities are seen. A right-sided chest port is noted ending about the proximal SVC. IMPRESSION: No acute cardiopulmonary process seen. Electronically Signed   By: Garald Balding M.D.   On: 12/09/2016 01:49   Dg Hip Operative Unilat W Or W/o Pelvis Right  Result Date: 12/09/2016 CLINICAL DATA:  Displaced femoral neck fracture. EXAM: OPERATIVE right HIP (WITH PELVIS IF PERFORMED) 1 VIEWS TECHNIQUE: Fluoroscopic spot image(s) were submitted for interpretation post-operatively. COMPARISON:  Radiographs 12/09/2016 FINDINGS: Bipolar hip prosthesis in good position without complicating features. The tip of the femoral stem is not included. IMPRESSION: Bipolar hip prosthesis. Electronically Signed   By: Marijo Sanes M.D.   On: 12/09/2016 13:21   Dg Hip Unilat W Or W/o Pelvis 2-3 Views Right  Result Date: 12/09/2016 CLINICAL DATA:  Right hip arthroplasty. EXAM: DG HIP (WITH OR WITHOUT PELVIS) 2-3V RIGHT COMPARISON:  Pelvic x-rays from same day. FINDINGS: Postsurgical changes related to interval right hip hemiarthroplasty. Alignment is anatomic. No evidence of hardware failure. Expected postsurgical changes including skin staples, wound VAC,  and subcutaneous emphysema about the right hip. IMPRESSION: Interval right hip hemiarthroplasty without evidence of acute postoperative complication. Electronically Signed   By: Titus Dubin M.D.   On: 12/09/2016 14:27   Dg Hip Unilat W Or Wo Pelvis 2-3 Views Right  Result Date: 12/09/2016 CLINICAL DATA:  Status post fall, with right hip pain. Initial encounter. EXAM: DG HIP (WITH OR WITHOUT PELVIS) 2-3V RIGHT COMPARISON:  None. FINDINGS: There is a displaced subcapital fracture of the right femoral neck, with medial displacement of the femoral head. Both femoral heads are seated normally within their respective acetabula. No significant  degenerative change is appreciated. The sacroiliac joints are unremarkable in appearance. The visualized bowel gas pattern is grossly unremarkable in appearance. IMPRESSION: Displaced subcapital fracture of the right femoral neck. Electronically Signed   By: Garald Balding M.D.   On: 12/09/2016 01:48    Micro Results     Recent Results (from the past 240 hour(s))  MRSA PCR Screening     Status: None   Collection Time: 12/09/16  6:20 AM  Result Value Ref Range Status   MRSA by PCR NEGATIVE NEGATIVE Final    Comment:        The GeneXpert MRSA Assay (FDA approved for NASAL specimens only), is one component of a comprehensive MRSA colonization surveillance program. It is not intended to diagnose MRSA infection nor to guide or monitor treatment for MRSA infections.        Today   Subjective:   Chelsey Watkins today has no headache,no chest abdominal pain,no new weakness tingling or numbness, feels much better wants to go home today.   Objective:   Blood pressure (!) 129/59, pulse 85, temperature 98.4 F (36.9 C), temperature source Oral, resp. rate 18, height 5\' 2"  (1.575 m), weight 52.5 kg (115 lb 12.8 oz), SpO2 97 %.  No intake or output data in the 24 hours ending 12/12/16 1422  Exam Awake Alert, Oriented x 3, No new F.N deficits, Normal affect [place].AT,PERRAL Supple Neck,No JVD, No cervical lymphadenopathy appriciated.  Symmetrical Chest wall movement, Good air movement bilaterally, CTAB RRR,No Gallops,Rubs or new Murmurs, No Parasternal Heave +ve B.Sounds, Abd Soft, Non tender, No organomegaly appriciated, No rebound -guarding or rigidity. No Cyanosis, Clubbing or edema, No new Rash or bruise  Data Review   CBC w Diff:  Lab Results  Component Value Date   WBC 11.3 (H) 12/09/2016   HGB 7.9 (L) 12/09/2016   HCT 24.6 (L) 12/09/2016   PLT 189 12/09/2016    CMP:  Lab Results  Component Value Date   NA 135 12/10/2016   K 4.4 12/10/2016   CL 105 12/10/2016    CO2 25 12/10/2016   BUN 10 12/10/2016   CREATININE 0.54 12/10/2016  .   Total Time in preparing paper work, data evaluation and todays exam - 4 minutes  Saban Heinlen M.D on 12/11/2016 at 2:22 PM    Note: This dictation was prepared with Dragon dictation along with smaller phrase technology. Any transcriptional errors that result from this process are unintentional.

## 2016-12-13 LAB — SURGICAL PATHOLOGY

## 2016-12-14 NOTE — Anesthesia Postprocedure Evaluation (Signed)
Anesthesia Post Note  Patient: Chelsey Watkins  Procedure(s) Performed: ANTERIOR APPROACH HEMI HIP ARTHROPLASTY (Right Hip)  Patient location during evaluation: PACU Anesthesia Type: General Level of consciousness: awake and alert Pain management: pain level controlled Vital Signs Assessment: post-procedure vital signs reviewed and stable Respiratory status: spontaneous breathing, nonlabored ventilation, respiratory function stable and patient connected to nasal cannula oxygen Cardiovascular status: blood pressure returned to baseline and stable Postop Assessment: no apparent nausea or vomiting Anesthetic complications: no     Last Vitals:  Vitals:   12/11/16 0014 12/11/16 0829  BP: (!) 98/55 (!) 129/59  Pulse: 88 85  Resp: 18 18  Temp: 37 C 36.9 C  SpO2: 98% 97%    Last Pain:  Vitals:   12/11/16 1043  TempSrc:   PainSc: 0-No pain                 Martha Clan

## 2017-04-19 ENCOUNTER — Encounter: Payer: Self-pay | Admitting: *Deleted

## 2017-04-25 ENCOUNTER — Telehealth: Payer: Self-pay | Admitting: *Deleted

## 2017-04-25 NOTE — Telephone Encounter (Signed)
Left message for patient to call the office back regarding appointment. Patient called the answering service to cancel.

## 2017-04-26 ENCOUNTER — Ambulatory Visit: Payer: Self-pay | Admitting: General Surgery

## 2017-05-04 ENCOUNTER — Encounter: Payer: Self-pay | Admitting: General Surgery

## 2017-05-04 ENCOUNTER — Ambulatory Visit: Payer: Medicare HMO | Admitting: General Surgery

## 2017-05-04 VITALS — BP 116/62 | HR 72 | Resp 12 | Ht 62.0 in | Wt 86.5 lb

## 2017-05-04 DIAGNOSIS — C252 Malignant neoplasm of tail of pancreas: Secondary | ICD-10-CM

## 2017-05-04 NOTE — Progress Notes (Signed)
Patient ID: Chelsey Watkins, female   DOB: Mar 27, 1945, 72 y.o.   MRN: [id number]  Chief Complaint  Patient presents with  . Other    HPI Chelsey Watkins is a 72 y.o. female.  Here for second opinion and evaluation of pancreatic cancer. She has a question regarding surgery. She states the Dr Su Grand in [place] has told her surgery is not an option. She admits to about a 40 pound weight loss. She is still getting chemotherapy once a week with [facility]  HPI  Past Medical History:  Diagnosis Date  . Cancer ([place])    skin ca  . Kidney stones   . Pancreatic cancer [facility])     Past Surgical History:  Procedure Laterality Date  . ANTERIOR APPROACH HEMI HIP ARTHROPLASTY Right 12/09/2016   Procedure: ANTERIOR APPROACH HEMI HIP ARTHROPLASTY;  Surgeon: Hessie Knows, MD;  Location: [facility] ORS;  Service: Orthopedics;  Laterality: Right;  . CESAREAN SECTION  1988  . FRACTURE SURGERY Right 1955   arm  . KIDNEY STONE SURGERY    . none    . PORTA CATH INSERTION  05/17/2016  . STAPEDECTOMY Right 2009  . TONSILLECTOMY      Family History  Problem Relation Age of Onset  . Breast cancer Paternal Grandmother 3  . Prostate cancer Father   . Hypertension Neg Hx   . Diabetes Mellitus II Neg Hx     Social History Social History   Tobacco Use  . Smoking status: Former Smoker    Years: 20.00    Last attempt to quit: 02/28/1998    Years since quitting: 19.1  . Smokeless tobacco: Never Used  Substance Use Topics  . Alcohol use: No  . Drug use: No    No Known Allergies  Current Outpatient Medications  Medication Sig Dispense Refill  . ELIQUIS 2.5 MG TABS tablet     . fentaNYL (DURAGESIC - DOSED MCG/HR) 75 MCG/HR     . mirtazapine (REMERON) 15 MG tablet Take 15 mg by mouth.    . Oxycodone HCl 10 MG TABS Take 20 mg by mouth.    . polyethylene glycol (MIRALAX / GLYCOLAX) packet Take by mouth.    . dexamethasone (DECADRON) 4 MG tablet Take 2 tablets by mouth daily.   5  . lidocaine-prilocaine (EMLA) cream Apply topically.    . ondansetron (ZOFRAN) 8 MG tablet Take by mouth.    . prochlorperazine (COMPAZINE) 10 MG tablet Take by mouth.     No current facility-administered medications for this visit.     Review of Systems Review of Systems  Constitutional: Negative.   Respiratory: Negative.   Cardiovascular: Negative.   Gastrointestinal: Positive for constipation.    Blood pressure 116/62, pulse 72, resp. rate 12, height 5\' 2"  (1.575 m), weight 86 lb 8 oz (39.2 kg), SpO2 98 %.  Physical Exam Physical Exam  Constitutional: She is oriented to person, place, and time. She appears well-developed and well-nourished.  Neurological: She is alert and oriented to person, place, and time.  Skin: Skin is warm and dry.  Psychiatric: Her behavior is normal.    Data Reviewed April 11, 2017 CT of the abdomen and pelvis completed at [facility] identified metastatic disease to the liver.  Large mass in the left upper quadrant involving the spleen, splenic flexure of the colon, tail the pancreas and the superior pole of the kidney.  Assessment    Unresectable carcinoma of the pancreas.    Plan    No  indication for surgical intervention unless she develops colonic obstruction, and at that time a palliative bypass can be completed.  Follow up as needed.     HPI, Physical Exam, Assessment and Plan have been scribed under the direction and in the presence of Robert Bellow, MD. Karie Fetch, RN  I have completed the exam and reviewed the above documentation for accuracy and completeness.  I agree with the above.  Haematologist has been used and any errors in dictation or transcription are unintentional.  Hervey Ard, M.D., F.A.C.S.  Chelsey Watkins 05/06/2017, 10:28 AM

## 2017-05-04 NOTE — Patient Instructions (Signed)
The patient is aware to call back for any questions or concerns.  

## 2017-05-06 DIAGNOSIS — C252 Malignant neoplasm of tail of pancreas: Secondary | ICD-10-CM | POA: Insufficient documentation

## 2017-09-28 DEATH — deceased

## 2018-08-11 IMAGING — XA DG HIP (WITH PELVIS) OPERATIVE*R*
1 series · 1 of 1 positions shown · non-contrast
Comparison: Radiographs 12/09/2016

CLINICAL DATA: Displaced femoral neck fracture.

EXAM:
OPERATIVE right HIP (WITH PELVIS IF PERFORMED) 1 VIEWS
TECHNIQUE: Fluoroscopic spot image(s) were submitted for interpretation
post-operatively.

[Series 4: ortho standard · 1 of 1 slices shown]
[im 1/1]
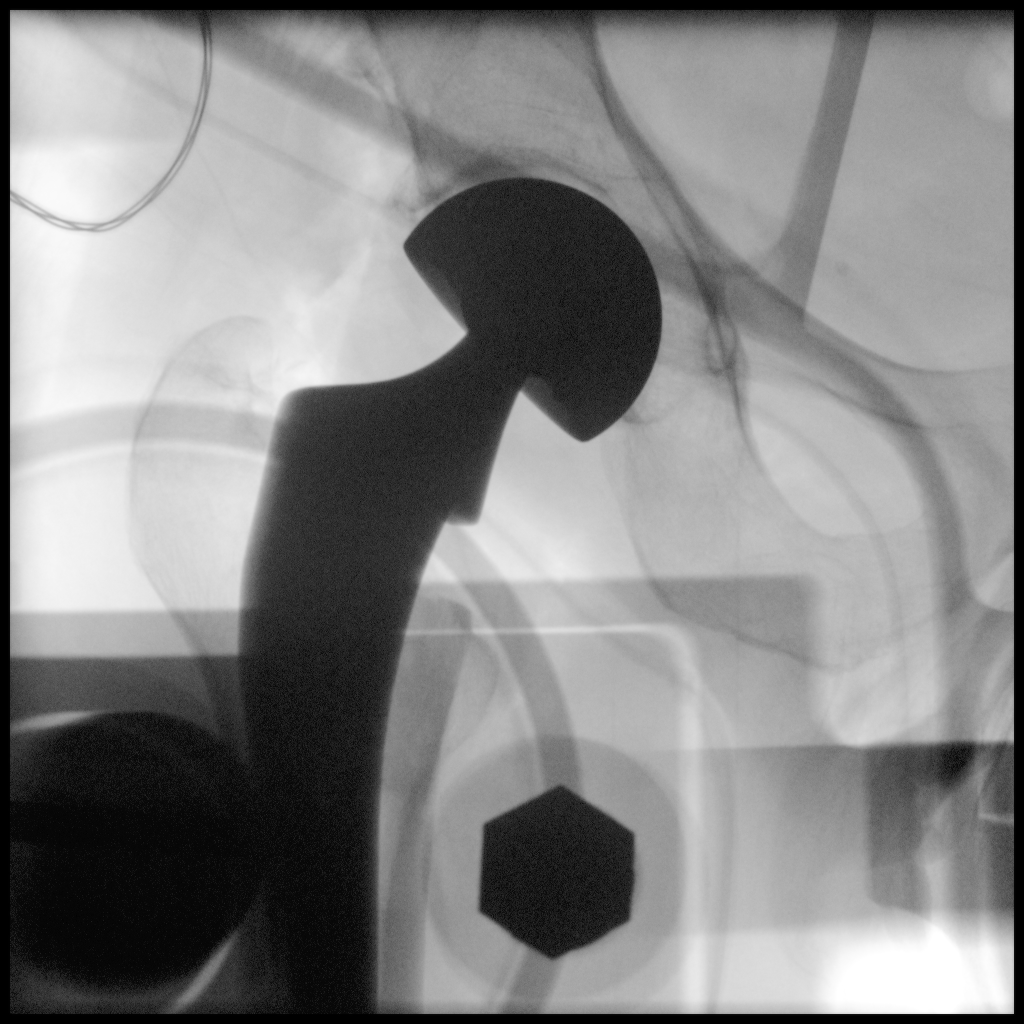

[1 of 1 positions shown; findings below may reference images not displayed]

FINDINGS: Bipolar hip prosthesis in good position without complicating
features. The tip of the femoral stem is not included.
IMPRESSION: Bipolar hip prosthesis.
